# Patient Record
Sex: Male | Born: 1960 | Race: Black or African American | Hispanic: No | Marital: Married | Smoking: Never smoker
Health system: Southern US, Community
[De-identification: ages and names within clinical notes are randomized; demographics above are authoritative.]

## PROBLEM LIST (undated history)

## (undated) DIAGNOSIS — I10 Essential (primary) hypertension: Secondary | ICD-10-CM

## (undated) HISTORY — PX: HEMORRHOID SURGERY: SHX153

## (undated) HISTORY — PX: FOOT SURGERY: SHX648

---

## 2008-12-18 ENCOUNTER — Encounter: Admission: RE | Admit: 2008-12-18 | Discharge: 2008-12-18 | Payer: Self-pay | Admitting: Pulmonary Disease

## 2011-06-01 DIAGNOSIS — K635 Polyp of colon: Secondary | ICD-10-CM | POA: Insufficient documentation

## 2011-06-06 DIAGNOSIS — I10 Essential (primary) hypertension: Secondary | ICD-10-CM | POA: Insufficient documentation

## 2012-10-02 DIAGNOSIS — Z8249 Family history of ischemic heart disease and other diseases of the circulatory system: Secondary | ICD-10-CM | POA: Insufficient documentation

## 2015-05-13 DIAGNOSIS — E785 Hyperlipidemia, unspecified: Secondary | ICD-10-CM | POA: Insufficient documentation

## 2015-05-13 DIAGNOSIS — D72819 Decreased white blood cell count, unspecified: Secondary | ICD-10-CM | POA: Insufficient documentation

## 2015-12-04 ENCOUNTER — Emergency Department (INDEPENDENT_AMBULATORY_CARE_PROVIDER_SITE_OTHER)
Admission: EM | Admit: 2015-12-04 | Discharge: 2015-12-04 | Disposition: A | Discharge status: Home / Self Care | Source: Home / Self Care | Attending: Family Medicine | Admitting: Family Medicine

## 2015-12-04 ENCOUNTER — Emergency Department (INDEPENDENT_AMBULATORY_CARE_PROVIDER_SITE_OTHER)

## 2015-12-04 ENCOUNTER — Encounter: Payer: Self-pay | Admitting: *Deleted

## 2015-12-04 DIAGNOSIS — B9789 Other viral agents as the cause of diseases classified elsewhere: Principal | ICD-10-CM

## 2015-12-04 DIAGNOSIS — J069 Acute upper respiratory infection, unspecified: Secondary | ICD-10-CM

## 2015-12-04 DIAGNOSIS — M940 Chondrocostal junction syndrome [Tietze]: Secondary | ICD-10-CM

## 2015-12-04 DIAGNOSIS — R05 Cough: Secondary | ICD-10-CM | POA: Diagnosis not present

## 2015-12-04 HISTORY — DX: Essential (primary) hypertension: I10

## 2015-12-04 NOTE — ED Provider Notes (Signed)
CSN: [id number]     Arrival date & time 12/04/15  1738 History   First MD Initiated Contact with Patient 12/04/15 1826     Chief Complaint  Patient presents with  . Cough  . Chest Pain      HPI Comments: About 1.5 weeks ago patient developed sinus congestion and sore throat, followed by a productive cough.  During the past 2 to 3 days he has developed pleuritic pain in his right anterior chest.  No fevers, chills, and sweats.  The history is provided by the patient.    Past Medical History  Diagnosis Date  . Hypertension    Past Surgical History  Procedure Laterality Date  . Hemorrhoid surgery    . Foot surgery     Family History  Problem Relation Age of Onset  . Cancer Mother     ovarian  . Diabetes Father   . Stroke Father    Social History  Substance Use Topics  . Smoking status: Never Smoker   . Smokeless tobacco: Never Used  . Alcohol Use: No    Review of Systems + sore throat + cough + sneezing + pleuritic pain + wheezing + nasal congestion + post-nasal drainage No sinus pain/pressure No itchy/red eyes No earache No hemoptysis No SOB No fever/chills + nausea No vomiting No abdominal pain + diarrhea, resolved No urinary symptoms No skin rash + fatigue No myalgias No headache Used OTC meds without relief  Allergies  Review of patient's allergies indicates no known allergies.  Home Medications   Prior to Admission medications   Medication Sig Start Date End Date Taking? Authorizing Provider  amLODipine (NORVASC) 10 MG tablet Take by mouth daily.   Yes Historical Provider, MD   Meds Ordered and Administered this Visit  Medications - No data to display  BP 152/75 mmHg  Pulse 59  Temp(Src) 98.2 F (36.8 C) (Oral)  Resp 16  Ht  (1.702 m)  Wt 178 lb (80.74 kg)  BMI 27.87 kg/m2  SpO2 100% No data found.   Physical Exam  Constitutional: He is oriented to person, place, and time. He appears well-developed and well-nourished. No  distress.  HENT:  Head: Normocephalic.  Right Ear: Tympanic membrane and ear canal normal.  Left Ear: Tympanic membrane and ear canal normal.  Mouth/Throat: Oropharynx is clear and moist.  Eyes: Conjunctivae are normal. Pupils are equal, round, and reactive to light.  Neck: Neck supple.  Cardiovascular: Normal heart sounds.   Pulmonary/Chest: Breath sounds normal. He has no wheezes. He has no rales. He exhibits tenderness.    There is tenderness to palpation over the right anterior chest as noted on diagram.    Abdominal: There is no tenderness.  Musculoskeletal: He exhibits no edema.  Lymphadenopathy:    He has no cervical adenopathy.  Neurological: He is alert and oriented to person, place, and time.  Skin: Skin is warm and dry. No rash noted. He is not diaphoretic.  Nursing note and vitals reviewed.   ED Course  Procedures  None  Imaging Review Dg Chest 2 View  12/04/2015  CLINICAL DATA:  Cough for 1-1/2 weeks EXAM: CHEST  2 VIEW COMPARISON:  12/18/2008 FINDINGS: The heart size and mediastinal contours are within normal limits. Both lungs are clear. The visualized skeletal structures are unremarkable. IMPRESSION: No active cardiopulmonary disease. Electronically Signed   By: Alcide Clever M.D.   On: 12/04/2015 19:20      MDM   1. Viral URI with  cough   2. Costochondritis     Apply ice pack for 20 to 30 minutes, 3 to 4 times daily  Continue until pain decreases.  May take Ibuprofen 200mg , 4 tabs every 8 hours with food for 3 to 5 days until chest pain improves.   May take Delsym Cough Suppressant at bedtime for nighttime cough.  Followup with Family Doctor if not improved in one week.     Lattie HawStephen A Escher Harr, MD 12/08/15 [phone]

## 2015-12-04 NOTE — Discharge Instructions (Signed)
Apply ice pack for 20 to 30 minutes, 3 to 4 times daily  Continue until pain decreases.  May take Ibuprofen 200mg , 4 tabs every 8 hours with food for 3 to 5 days until chest pain improves.   May take Delsym Cough Suppressant at bedtime for nighttime cough.    Costochondritis Costochondritis, sometimes called Tietze syndrome, is a swelling and irritation (inflammation) of the tissue (cartilage) that connects your ribs with your breastbone (sternum). It causes pain in the chest and rib area. Costochondritis usually goes away on its own over time. It can take up to 6 weeks or longer to get better, especially if you are unable to limit your activities. CAUSES  Some cases of costochondritis have no known cause. Possible causes include:  Injury (trauma).  Exercise or activity such as lifting.  Severe coughing. SIGNS AND SYMPTOMS  Pain and tenderness in the chest and rib area.  Pain that gets worse when coughing or taking deep breaths.  Pain that gets worse with specific movements. DIAGNOSIS  Your health care provider will do a physical exam and ask about your symptoms. Chest X-rays or other tests may be done to rule out other problems. TREATMENT  Costochondritis usually goes away on its own over time. Your health care provider may prescribe medicine to help relieve pain. HOME CARE INSTRUCTIONS   Avoid exhausting physical activity. Try not to strain your ribs during normal activity. This would include any activities using chest, abdominal, and side muscles, especially if heavy weights are used.  Apply ice to the affected area for the first 2 days after the pain begins.  Put ice in a plastic bag.  Place a towel between your skin and the bag.  Leave the ice on for 20 minutes, 2-3 times a day.  Only take over-the-counter or prescription medicines as directed by your health care provider. SEEK MEDICAL CARE IF:  You have redness or swelling at the rib joints. These are signs of  infection.  Your pain does not go away despite rest or medicine. SEEK IMMEDIATE MEDICAL CARE IF:   Your pain increases or you are very uncomfortable.  You have shortness of breath or difficulty breathing.  You cough up blood.  You have worse chest pains, sweating, or vomiting.  You have a fever or persistent symptoms for more than 2-3 days.  You have a fever and your symptoms suddenly get worse. MAKE SURE YOU:   Understand these instructions.  Will watch your condition.  Will get help right away if you are not doing well or get worse.   This information is not intended to replace advice given to you by your health care provider. Make sure you discuss any questions you have with your health care provider.   Document Released: 06/15/2005 Document Revised: 06/26/2013 Document Reviewed: 04/09/2013 Elsevier Interactive Patient Education [employer]2016 [employer] Inc[employer]

## 2015-12-04 NOTE — ED Notes (Signed)
Pt c/o productive cough with yellow sputum, sinus drainage and congestion and right sided chest pain that is worse with movement x 1 1/2 weeks. Afebrile.

## 2016-10-10 DIAGNOSIS — Z1159 Encounter for screening for other viral diseases: Secondary | ICD-10-CM | POA: Insufficient documentation

## 2017-05-15 ENCOUNTER — Ambulatory Visit: Admitting: Physical Therapy

## 2017-06-21 ENCOUNTER — Encounter (HOSPITAL_BASED_OUTPATIENT_CLINIC_OR_DEPARTMENT_OTHER): Payer: Self-pay

## 2017-06-21 ENCOUNTER — Emergency Department (HOSPITAL_BASED_OUTPATIENT_CLINIC_OR_DEPARTMENT_OTHER)

## 2017-06-21 ENCOUNTER — Emergency Department (HOSPITAL_BASED_OUTPATIENT_CLINIC_OR_DEPARTMENT_OTHER)
Admission: EM | Admit: 2017-06-21 | Discharge: 2017-06-21 | Disposition: A | Discharge status: Home / Self Care | Attending: Emergency Medicine | Admitting: Emergency Medicine

## 2017-06-21 DIAGNOSIS — I10 Essential (primary) hypertension: Secondary | ICD-10-CM | POA: Diagnosis not present

## 2017-06-21 DIAGNOSIS — Y939 Activity, unspecified: Secondary | ICD-10-CM | POA: Insufficient documentation

## 2017-06-21 DIAGNOSIS — S199XXA Unspecified injury of neck, initial encounter: Secondary | ICD-10-CM | POA: Diagnosis present

## 2017-06-21 DIAGNOSIS — S161XXA Strain of muscle, fascia and tendon at neck level, initial encounter: Secondary | ICD-10-CM | POA: Insufficient documentation

## 2017-06-21 DIAGNOSIS — Y92410 Unspecified street and highway as the place of occurrence of the external cause: Secondary | ICD-10-CM | POA: Diagnosis not present

## 2017-06-21 DIAGNOSIS — Y999 Unspecified external cause status: Secondary | ICD-10-CM | POA: Diagnosis not present

## 2017-06-21 MED ORDER — IBUPROFEN 800 MG PO TABS: 800.0000 mg | ORAL_TABLET | Freq: Three times a day (TID) | ORAL | 0 refills | Status: DC | PRN

## 2017-06-21 MED ORDER — CYCLOBENZAPRINE HCL 10 MG PO TABS: 10.0000 mg | ORAL_TABLET | Freq: Two times a day (BID) | ORAL | 0 refills | Status: DC | PRN
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
## 2017-06-21 NOTE — Discharge Instructions (Signed)
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
## 2017-09-22 ENCOUNTER — Emergency Department (INDEPENDENT_AMBULATORY_CARE_PROVIDER_SITE_OTHER)
Admission: EM | Admit: 2017-09-22 | Discharge: 2017-09-22 | Disposition: A | Discharge status: Home / Self Care | Source: Home / Self Care | Attending: Family Medicine | Admitting: Family Medicine

## 2017-09-22 ENCOUNTER — Other Ambulatory Visit: Payer: Self-pay

## 2017-09-22 ENCOUNTER — Encounter: Payer: Self-pay | Admitting: Emergency Medicine

## 2017-09-22 ENCOUNTER — Emergency Department (INDEPENDENT_AMBULATORY_CARE_PROVIDER_SITE_OTHER)

## 2017-09-22 DIAGNOSIS — R05 Cough: Secondary | ICD-10-CM

## 2017-09-22 DIAGNOSIS — J40 Bronchitis, not specified as acute or chronic: Secondary | ICD-10-CM

## 2017-09-22 DIAGNOSIS — R0789 Other chest pain: Secondary | ICD-10-CM

## 2017-09-22 MED ORDER — AZITHROMYCIN 250 MG PO TABS: ORAL_TABLET | ORAL | 0 refills | Status: DC

## 2017-09-22 MED ORDER — PREDNISONE 20 MG PO TABS: ORAL_TABLET | ORAL | 0 refills | Status: DC

## 2017-09-22 NOTE — ED Triage Notes (Signed)
Reports he has had a cough and sore throat intermittently for one month. No OTCs today.

## 2017-09-22 NOTE — Discharge Instructions (Signed)
Take plain guaifenesin (1200mg extended release tabs such as Mucinex) twice daily, with plenty of water, for cough and congestion.  Get adequate rest.   °May take Delsym Cough Suppressant at bedtime for nighttime cough.  °Stop all antihistamines for now, and other non-prescription cough/cold preparations. °  °  °

## 2017-09-22 NOTE — ED Provider Notes (Signed)
[facility]    CSN: [id number] Arrival date & time: 09/22/17  1710     History   Chief Complaint Chief Complaint  Patient presents with  . Cough  . Sore Throat    HPI Mathew Carter is a 57 y.o. male.   Patient reports that he developed URI symptoms about a month ago with initial sore throat, fever, sinus congestion, and cough.  His cough has persisted and become worse during the past 3 to 4 days.  He has developed right chest "tightness" when coughing during the past 3 days.   The history is provided by the patient.    Past Medical History:  Diagnosis Date  . Hypertension     There are no active problems to display for this patient.   Past Surgical History:  Procedure Laterality Date  . FOOT SURGERY    . HEMORRHOID SURGERY         Home Medications    Prior to Admission medications   Medication Sig Start Date End Date Taking? Authorizing Provider  amLODipine (NORVASC) 10 MG tablet Take by mouth daily.    [provider]  azithromycin (ZITHROMAX Z-PAK) 250 MG tablet Take 2 tabs today; then begin one tab once daily for 4 more days. 09/22/17   Lattie Haw, MD  cyclobenzaprine (FLEXERIL) 10 MG tablet Take 1 tablet (10 mg total) by mouth 2 (two) times daily as needed for muscle spasms. 06/21/17   Long, Arlyss Repress, MD  ibuprofen (ADVIL,MOTRIN) 800 MG tablet Take 1 tablet (800 mg total) by mouth every 8 (eight) hours as needed. 06/21/17   Long, Arlyss Repress, MD  predniSONE (DELTASONE) 20 MG tablet Take one tab by mouth twice daily for 4 days, then one daily. Take with food. 09/22/17   Lattie Haw, MD    Family History Family History  Problem Relation Age of Onset  . Cancer Mother        ovarian  . Diabetes Father   . Stroke Father     Social History Social History   Tobacco Use  . Smoking status: Never Smoker  . Smokeless tobacco: Never Used  Substance Use Topics  . Alcohol use: Yes    Comment: occ  . Drug use: No     Allergies    Patient has no known allergies.   Review of Systems Review of Systems + sore throat + cough + pleuritic pain No wheezing + nasal congestion + post-nasal drainage No sinus pain/pressure No itchy/red eyes No earache No hemoptysis No SOB + fever/chills No nausea No vomiting No abdominal pain No diarrhea No urinary symptoms No skin rash + fatigue No myalgias No headache Used OTC meds without relief   Physical Exam Triage Vital Signs ED Triage Vitals  Enc Vitals Group     BP --      Pulse Rate 09/22/17 1754 (!) 55     Resp --      Temp 09/22/17 1754 98.6 F (37 C)     Temp Source 09/22/17 1754 Oral     SpO2 09/22/17 1754 100 %     Weight 09/22/17 1755 178 lb (80.7 kg)     Height 09/22/17 1755 5\' 7"  (1.702 m)     Head Circumference --      Peak Flow --      Pain Score --      Pain Loc --      Pain Edu? --      Excl. in  GC? --    No data found.  Updated Vital Signs Pulse (!) 55   Temp 98.6 F (37 C) (Oral)   Ht 5\' 7"  (1.702 m)   Wt 178 lb (80.7 kg)   SpO2 100%   BMI 27.88 kg/m   Visual Acuity Right Eye Distance:   Left Eye Distance:   Bilateral Distance:    Right Eye Near:   Left Eye Near:    Bilateral Near:     Physical Exam Nursing notes and Vital Signs reviewed. Appearance:  Patient appears stated age, and in no acute distress Eyes:  Pupils are equal, round, and reactive to light and accomodation.  Extraocular movement is intact.  Conjunctivae are not inflamed  Ears:  Canals normal.  Tympanic membranes normal.  Nose:  Mildly congested turbinates.  No sinus tenderness.  Pharynx:  Normal Neck:  Supple.  Enlarged posterior/lateral nodes are palpated bilaterally, tender to palpation on the left.   Lungs:  Clear to auscultation.  Breath sounds are equal.  Moving air well. Heart:  Regular rate and rhythm without murmurs, rubs, or gallops.  Abdomen:  Nontender without masses or hepatosplenomegaly.  Bowel sounds are present.  No CVA or flank  tenderness.  Extremities:  No edema.  Skin:  No rash present.    UC Treatments / Results  Labs (all labs ordered are listed, but only abnormal results are displayed) Labs Reviewed - No data to display  EKG  EKG Interpretation None       Radiology Dg Chest 2 View  Result Date: 09/22/2017 CLINICAL DATA:  Cough for 1 month. Right-sided chest pain for 4 days. History of hypertension. EXAM: CHEST  2 VIEW COMPARISON:  12/04/2015 FINDINGS: Mild hyperinflation. The heart size and mediastinal contours are within normal limits. Both lungs are clear. The visualized skeletal structures are unremarkable. IMPRESSION: No active cardiopulmonary disease. Electronically Signed   By: Burman NievesWilliam  Stevens M.D.   On: 09/22/2017 19:05    Procedures Procedures (including critical care time)  Medications Ordered in UC Medications - No data to display   Initial Impression / Assessment and Plan / UC Course  I have reviewed the triage vital signs and the nursing notes.  Pertinent labs & imaging results that were available during my care of the patient were reviewed by me and considered in my medical decision making (see chart for details).    Begin Z-pak for atypical coverage. Begin prednisone burst/taper. Take plain guaifenesin (1200mg  extended release tabs such as Mucinex) twice daily, with plenty of water, for cough and congestion.  Get adequate rest.   May take Delsym Cough Suppressant at bedtime for nighttime cough.  Stop all antihistamines for now, and other non-prescription cough/cold preparations. Followup with Family Doctor if not improved in one week.     Final Clinical Impressions(s) / UC Diagnoses   Final diagnoses:  Bronchitis    ED Discharge Orders        Ordered    azithromycin (ZITHROMAX Z-PAK) 250 MG tablet     09/22/17 1924    predniSONE (DELTASONE) 20 MG tablet     09/22/17 1924          Lattie HawBeese, Stephen A, MD 09/25/17 2037

## 2017-09-24 ENCOUNTER — Telehealth: Payer: Self-pay | Admitting: Emergency Medicine

## 2017-09-24 NOTE — Telephone Encounter (Signed)
Patient states he is feeling better. Advised to CB with concerns. AP, CMA

## 2017-10-11 DIAGNOSIS — R7309 Other abnormal glucose: Secondary | ICD-10-CM | POA: Insufficient documentation

## 2017-11-06 ENCOUNTER — Other Ambulatory Visit: Payer: Self-pay

## 2017-11-06 ENCOUNTER — Emergency Department (INDEPENDENT_AMBULATORY_CARE_PROVIDER_SITE_OTHER)
Admission: EM | Admit: 2017-11-06 | Discharge: 2017-11-06 | Disposition: A | Discharge status: Home / Self Care | Source: Home / Self Care | Attending: Family Medicine | Admitting: Family Medicine

## 2017-11-06 DIAGNOSIS — R6889 Other general symptoms and signs: Secondary | ICD-10-CM | POA: Diagnosis not present

## 2017-11-06 MED ORDER — BENZONATATE 100 MG PO CAPS: 100.0000 mg | ORAL_CAPSULE | Freq: Three times a day (TID) | ORAL | 0 refills | Status: DC

## 2017-11-06 MED ORDER — OSELTAMIVIR PHOSPHATE 75 MG PO CAPS: 75.0000 mg | ORAL_CAPSULE | Freq: Two times a day (BID) | ORAL | 0 refills | Status: DC

## 2017-11-06 NOTE — Discharge Instructions (Signed)
°  You may take 500mg acetaminophen every 4-6 hours or in combination with ibuprofen 400-600mg every 6-8 hours as needed for pain, inflammation, and fever. ° °Be sure to drink at least eight 8oz glasses of water to stay well hydrated and get at least 8 hours of sleep at night, preferably more while sick.  ° °Oseltamivir (Tamiflu) may cause stomach upset including nausea, vomiting and diarrhea.  It may also cause dizziness or hallucinations in children.  To help prevent stomach upset, you may take this medication with food.  If you are still having unwanted symptoms, you may stop taking this medication as it is not as important to finish the entire course like antibiotics.  If you have questions/concerns please call our office or follow up with your primary care provider.   ° °

## 2017-11-06 NOTE — ED Provider Notes (Signed)
[facility]    CSN: [id number] Arrival date & time: 11/06/17  4098     History   Chief Complaint Chief Complaint  Patient presents with  . Generalized Body Aches    HPI Mathew Carter is a 57 y.o. male.   HPI  Mathew Carter is a 57 y.o. male presenting to UC with c/o "not feeling good" 2 days ago, felt much worse yesterday.  He is c/o generalized body aches and nasal congestion, subjective fever and fatigue.  Minimal cough. Denies n/v/d. No known sick contacts. He did not get the flu vaccine.    Past Medical History:  Diagnosis Date  . Hypertension     There are no active problems to display for this patient.   Past Surgical History:  Procedure Laterality Date  . FOOT SURGERY    . HEMORRHOID SURGERY         Home Medications    Prior to Admission medications   Medication Sig Start Date End Date Taking? Authorizing Provider  amLODipine (NORVASC) 10 MG tablet Take by mouth daily.    [provider]  azithromycin (ZITHROMAX Z-PAK) 250 MG tablet Take 2 tabs today; then begin one tab once daily for 4 more days. 09/22/17   Lattie Haw, MD  benzonatate (TESSALON) 100 MG capsule Take 1-2 capsules (100-200 mg total) by mouth every 8 (eight) hours. 11/06/17   Lurene Shadow, PA-C  cyclobenzaprine (FLEXERIL) 10 MG tablet Take 1 tablet (10 mg total) by mouth 2 (two) times daily as needed for muscle spasms. 06/21/17   Long, Arlyss Repress, MD  ibuprofen (ADVIL,MOTRIN) 800 MG tablet Take 1 tablet (800 mg total) by mouth every 8 (eight) hours as needed. 06/21/17   Long, Arlyss Repress, MD  oseltamivir (TAMIFLU) 75 MG capsule Take 1 capsule (75 mg total) by mouth every 12 (twelve) hours. 11/06/17   Lurene Shadow, PA-C  predniSONE (DELTASONE) 20 MG tablet Take one tab by mouth twice daily for 4 days, then one daily. Take with food. 09/22/17   Lattie Haw, MD    Family History Family History  Problem Relation Age of Onset  . Cancer Mother        ovarian  .  Diabetes Father   . Stroke Father     Social History Social History   Tobacco Use  . Smoking status: Never Smoker  . Smokeless tobacco: Never Used  Substance Use Topics  . Alcohol use: No    Frequency: Never    Comment: occ  . Drug use: No     Allergies   Patient has no known allergies.   Review of Systems Review of Systems  Constitutional: Positive for chills and fever.  HENT: Positive for congestion and rhinorrhea. Negative for ear pain, sore throat, trouble swallowing and voice change.   Respiratory: Positive for cough. Negative for shortness of breath.   Cardiovascular: Negative for chest pain and palpitations.  Gastrointestinal: Negative for abdominal pain, diarrhea, nausea and vomiting.  Musculoskeletal: Positive for arthralgias, back pain and myalgias.  Skin: Negative for rash.  Neurological: Positive for weakness and headaches. Negative for dizziness and light-headedness.     Physical Exam Triage Vital Signs ED Triage Vitals  Enc Vitals Group     BP 11/06/17 1016 (!) 151/84     Pulse Rate 11/06/17 1016 66     Resp --      Temp 11/06/17 1016 98 F (36.7 C)     Temp Source 11/06/17 1016 Oral  SpO2 11/06/17 1016 97 %     Weight 11/06/17 1017 189 lb (85.7 kg)     Height 11/06/17 1017 5\' 7"  (1.702 m)     Head Circumference --      Peak Flow --      Pain Score 11/06/17 1017 0     Pain Loc --      Pain Edu? --      Excl. in GC? --    No data found.  Updated Vital Signs BP (!) 151/84 (BP Location: Right Arm)   Pulse 66   Temp 98 F (36.7 C) (Oral)   Ht 5\' 7"  (1.702 m)   Wt 189 lb (85.7 kg)   SpO2 97%   BMI 29.60 kg/m   Visual Acuity Right Eye Distance:   Left Eye Distance:   Bilateral Distance:    Right Eye Near:   Left Eye Near:    Bilateral Near:     Physical Exam  Constitutional: He is oriented to person, place, and time. He appears well-developed and well-nourished. No distress.  HENT:  Head: Normocephalic and atraumatic.  Right  Ear: Tympanic membrane normal.  Left Ear: Tympanic membrane normal.  Nose: Nose normal. Right sinus exhibits no maxillary sinus tenderness and no frontal sinus tenderness. Left sinus exhibits no maxillary sinus tenderness and no frontal sinus tenderness.  Mouth/Throat: Uvula is midline, oropharynx is clear and moist and mucous membranes are normal.  Eyes: EOM are normal.  Neck: Normal range of motion. Neck supple.  Cardiovascular: Normal rate and regular rhythm.  Pulmonary/Chest: Effort normal and breath sounds normal. No stridor. No respiratory distress. He has no wheezes. He has no rales.  Musculoskeletal: Normal range of motion.  Neurological: He is alert and oriented to person, place, and time.  Skin: Skin is warm and dry. He is not diaphoretic.  Psychiatric: He has a normal mood and affect. His behavior is normal.  Nursing note and vitals reviewed.    UC Treatments / Results  Labs (all labs ordered are listed, but only abnormal results are displayed) Labs Reviewed - No data to display  EKG  EKG Interpretation None       Radiology No results found.  Procedures Procedures (including critical care time)  Medications Ordered in UC Medications - No data to display   Initial Impression / Assessment and Plan / UC Course  I have reviewed the triage vital signs and the nursing notes.  Pertinent labs & imaging results that were available during my care of the patient were reviewed by me and considered in my medical decision making (see chart for details).     Hx and exam c/w flu-like symptoms w/o evidence of underlying bacterial infection.  Will tx symptomatically  F/u with PCP in 1 week if needed.   Final Clinical Impressions(s) / UC Diagnoses   Final diagnoses:  Flu-like symptoms    ED Discharge Orders        Ordered    oseltamivir (TAMIFLU) 75 MG capsule  Every 12 hours     11/06/17 1028    benzonatate (TESSALON) 100 MG capsule  Every 8 hours     11/06/17  1028       Controlled Substance Prescriptions [place] Controlled Substance Registry consulted? Not Applicable   Rolla Platehelps, Reagan Klemz O, PA-C 11/06/17 1241

## 2017-11-06 NOTE — ED Triage Notes (Signed)
Pt stated that he was not feeling good Saturday evening, then felt much worse after church on Sunday.  Generalized aching all over, and nasal congestion,.

## 2018-02-21 ENCOUNTER — Encounter: Payer: Self-pay | Admitting: Family Medicine

## 2018-02-21 ENCOUNTER — Ambulatory Visit (INDEPENDENT_AMBULATORY_CARE_PROVIDER_SITE_OTHER): Admitting: Family Medicine

## 2018-02-21 DIAGNOSIS — N529 Male erectile dysfunction, unspecified: Secondary | ICD-10-CM | POA: Diagnosis not present

## 2018-02-21 DIAGNOSIS — G8929 Other chronic pain: Secondary | ICD-10-CM

## 2018-02-21 DIAGNOSIS — M545 Low back pain, unspecified: Secondary | ICD-10-CM

## 2018-02-21 DIAGNOSIS — M549 Dorsalgia, unspecified: Secondary | ICD-10-CM | POA: Insufficient documentation

## 2018-02-21 MED ORDER — SILDENAFIL CITRATE 100 MG PO TABS: 50.0000 mg | ORAL_TABLET | Freq: Every day | ORAL | 0 refills | Status: DC | PRN

## 2018-02-21 NOTE — Progress Notes (Signed)
Mathew Carter is a 57 y.o. male who [facility] today for back pain.   Mathew Brownsnthony has a 1.5 year history of back pain. He notes pain in the low back. He denies any radiating pain weakness or numbness. He denies any bowel or bladder dysfunction. He does note some erection dysfunction however.  He has tried Flexeril and ibuprofen which has helped a bit.  He notes his symptoms are worse with prolonged sitting.  He notes his symptoms improved with some home exercises.  He has been evaluated by neurosurgery  (Dr Eleanora Neighboravid S. Jones)and had reportedly had an MRI but is unaware of results.  This was in 2018.   ROS as above: No headache, visual changes, nausea, vomiting, diarrhea, constipation, dizziness, abdominal pain, skin rash, fevers, chills, night sweats, weight loss, swollen lymph nodes, body aches, joint swelling, muscle aches, chest pain, shortness of breath, mood changes, visual or auditory hallucinations.    Exam:  BP 137/81   Pulse 60   Ht 5\' 7"  (1.702 m)   Wt 190 lb (86.2 kg)   BMI 29.76 kg/m  Gen: Well NAD HEENT: EOMI,  MMM Lungs: Normal work of breathing. CTABL Heart: RRR no MRG Abd: NABS, Soft. Nondistended, Nontender Exts: Brisk capillary refill, warm and well perfused.  L-spine: Nontender to spinal midline.  Mildly tender to palpation bilateral lumbar paraspinal muscle group. Lumbar motion is normal. Lower extremity strength reflexes sensation are intact. Normal gait.   Assessment and Plan: 57 y.o. male with  Low back pain present for 1.5 years without injury.  Patient has reportedly already had what sounds like a reasonable evaluation for this with an MRI.  I will request medical records to obtain office notes and MRI report.  However he has not had treatment with physical therapy at this point.  As he improves with his home exercise program  I think it is reasonable to proceed with formal physical therapy.  Check back in about 6 weeks.  Additionally patient has erectile dysfunction.  I do not believe this is related to his back pain however MRI report will be helpful for this.  I am not his primary care provider however I think it is reasonable to treat his ED with viagra trial. Follow up with PCP for further Rx and treatment.   HTN: Follow up with PCP in the near future.    Orders Placed This Encounter  Procedures  . Ambulatory referral to Physical Therapy    Referral Priority:   Routine    Referral Type:   Physical Medicine    Referral Reason:   Specialty Services Required    Requested Specialty:   Physical Therapy   Meds ordered this encounter  Medications  . DISCONTD: sildenafil (VIAGRA) 100 MG tablet    Sig: Take 0.5-1 tablets (50-100 mg total) by mouth daily as needed for erectile dysfunction.    Dispense:  10 tablet    Refill:  0  . sildenafil (VIAGRA) 100 MG tablet    Sig: Take 0.5-1 tablets (50-100 mg total) by mouth daily  as needed for erectile dysfunction.    Dispense:  10 tablet    Refill:  0     Historical information moved to improve visibility of documentation.  Past Medical History:  Diagnosis Date  . Hypertension    Past Surgical History:  Procedure Laterality Date  . FOOT SURGERY    . HEMORRHOID SURGERY     Social History   Tobacco Use  . Smoking status: Never Smoker  . Smokeless tobacco: Never Used  Substance Use Topics  . Alcohol use: No    Frequency: Never    Comment: occ   family history includes Cancer in his mother; Diabetes in his father; Stroke in his father.  Medications: Current Outpatient Medications  Medication Sig Dispense Refill  . amLODipine (NORVASC) 10 MG tablet Take by mouth daily.    . valsartan-hydrochlorothiazide (DIOVAN-HCT) 160-12.5 MG tablet TAKE 1 TABLET DAILY    . aspirin 81 MG chewable tablet Chew by mouth.    . sildenafil (VIAGRA) 100 MG tablet Take  0.5-1 tablets (50-100 mg total) by mouth daily as needed for erectile dysfunction. 10 tablet 0   No current facility-administered medications for this visit.    No Known Allergies   Discussed warning signs or symptoms. Please see discharge instructions. Patient expresses understanding.  CC: Janece Canterbury, MD

## 2018-02-21 NOTE — Patient Instructions (Signed)
Thank you for coming in today. We will get records form Neurosurgery.  Attend PT Recheck with me in 6 weeks.  Come back or go to the emergency room if you notice new weakness new numbness problems walking or bowel or bladder problems.  For ED Try viagra.  Follow up with your doctor.   Sildenafil tablets (Viagra) What is this medicine? SILDENAFIL (sil DEN a fil) is used to treat erection problems in men. This medicine may be used for other purposes; ask your health care provider or pharmacist if you have questions. COMMON BRAND NAME(S): Viagra What should I tell my health care provider before I take this medicine? They need to know if you have any of these conditions: -bleeding disorders -eye or vision problems, including a rare inherited eye disease called retinitis pigmentosa -anatomical deformation of the penis, Peyronie's disease, or history of priapism (painful and prolonged erection) -heart disease, angina, a history of heart attack, irregular heart beats, or other heart problems -high or low blood pressure -history of blood diseases, like sickle cell anemia or leukemia -history of stomach bleeding -kidney disease -liver disease -stroke -an unusual or allergic reaction to sildenafil, other medicines, foods, dyes, or preservatives -pregnant or trying to get pregnant -breast-feeding How should I use this medicine? Take this medicine by mouth with a glass of water. Follow the directions on the prescription label. The dose is usually taken 1 hour before sexual activity. You should not take the dose more than once per day. Do not take your medicine more often than directed. Talk to your pediatrician regarding the use of this medicine in children. This medicine is not used in children for this condition. Overdosage: If you think you have taken too much of this medicine contact a poison control center or emergency room at once. NOTE: This medicine is only for you. Do not share this  medicine with others. What if I miss a dose? This does not apply. Do not take double or extra doses. What may interact with this medicine? Do not take this medicine with any of the following medications: -cisapride -nitrates like amyl nitrite, isosorbide dinitrate, isosorbide mononitrate, nitroglycerin -riociguat This medicine may also interact with the following medications: -antiviral medicines for HIV or AIDS -bosentan -certain medicines for benign prostatic hyperplasia (BPH) -certain medicines for blood pressure -certain medicines for fungal infections like ketoconazole and itraconazole -cimetidine -erythromycin -rifampin This list may not describe all possible interactions. Give your health care provider a list of all the medicines, herbs, non-prescription drugs, or dietary supplements you use. Also tell them if you smoke, drink alcohol, or use illegal drugs. Some items may interact with your medicine. What should I watch for while using this medicine? If you notice any changes in your vision while taking this drug, call your doctor or health care professional as soon as possible. Stop using this medicine and call your health care provider right away if you have a loss of sight in one or both eyes. Contact your doctor or health care professional right away if you have an erection that lasts longer than 4 hours or if it becomes painful. This may be a sign of a serious problem and must be treated right away to prevent permanent damage. If you experience symptoms of nausea, dizziness, chest pain or arm pain upon initiation of sexual activity after taking this medicine, you should refrain from further activity and call your doctor or health care professional as soon as possible. Do not drink alcohol to  excess (examples, 5 glasses of wine or 5 shots of whiskey) when taking this medicine. When taken in excess, alcohol can increase your chances of getting a headache or getting dizzy, increasing  your heart rate or lowering your blood pressure. Using this medicine does not protect you or your partner against HIV infection (the virus that causes AIDS) or other sexually transmitted diseases. What side effects may I notice from receiving this medicine? Side effects that you should report to your doctor or health care professional as soon as possible: -allergic reactions like skin rash, itching or hives, swelling of the face, lips, or tongue -breathing problems -changes in hearing -changes in vision -chest pain -fast, irregular heartbeat -prolonged or painful erection -seizures Side effects that usually do not require medical attention (report to your doctor or health care professional if they continue or are bothersome): -back pain -dizziness -flushing -headache -indigestion -muscle aches -nausea -stuffy or runny nose This list may not describe all possible side effects. Call your doctor for medical advice about side effects. You may report side effects to FDA at 1-800-FDA-1088. Where should I keep my medicine? Keep out of reach of children. Store at room temperature between 15 and 30 degrees C (59 and 86 degrees F). Throw away any unused medicine after the expiration date. NOTE: This sheet is a summary. It may not cover all possible information. If you have questions about this medicine, talk to your doctor, pharmacist, or health care provider.  2018 Elsevier/Gold Standard (2015-08-19 12:00:25)

## 2018-02-28 ENCOUNTER — Encounter: Payer: Self-pay | Admitting: Family Medicine

## 2018-02-28 NOTE — Progress Notes (Unsigned)
Records received from [facility] Patient is listed incorrectly in their medical records as last name Sharol HarnessSimmons (should be Kumpf)  Records will be scanned however MRI report from July 2018 shows (images available in PACS system) Impression: 1.  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  Mild right foraminal narrowing at L1-2 3. Mild left subarticular and foraminal narrowing at L4-5 4.  He is the most prominent disease is at L5-S1 with mild right subarticular moderate right foraminal stenosis. 5.  Multilevel facet hypertrophy and short pedicles as described above.  Per neurosurgery notes plan after MRI follow-up was for physical therapy referral.     Notes will be scanned

## 2018-03-01 ENCOUNTER — Encounter: Payer: Self-pay | Admitting: Physical Therapy

## 2018-03-01 ENCOUNTER — Ambulatory Visit (INDEPENDENT_AMBULATORY_CARE_PROVIDER_SITE_OTHER): Admitting: Physical Therapy

## 2018-03-01 DIAGNOSIS — M545 Low back pain, unspecified: Secondary | ICD-10-CM

## 2018-03-01 DIAGNOSIS — G8929 Other chronic pain: Secondary | ICD-10-CM | POA: Diagnosis not present

## 2018-03-01 DIAGNOSIS — M6281 Muscle weakness (generalized): Secondary | ICD-10-CM

## 2018-03-01 DIAGNOSIS — M62830 Muscle spasm of back: Secondary | ICD-10-CM | POA: Diagnosis not present

## 2018-03-01 NOTE — Patient Instructions (Addendum)
For the gym: Add in leg press Lat pull down Seated row.  Pelvic Tilt    Lie on back, legs bent. Exhale, tilting top of pelvis back, pubic bone up, to flatten lower back. Inhale, rolling pelvis opposite way, top forward, pubic bone down, arch in back. Repeat _10-20___ times. Do __1__ sessions per day.  Hip External Rotation With Pillow: Transverse Plane Stability - make sure you breathe.    KEEP BOTH KNEES BENT.  Slowly roll bent knee out. Be sure pelvis does not rotate. Do _10-20__ times. Restabilize pelvis. Repeat with other leg. Do _1-2__ sets, _1__ times per day.  Knee-to-Chest Stretch: Bilateral    With hands behind knees, pull both knees in to chest until a comfortable stretch is felt in lower back and buttocks. Keep back relaxed. Hold __30__ seconds. Repeat ____ times per set. Do ___1_ sets per session. Do ___1_ sessions per day.1   Quads / HF, Prone    Lie face down, knees together. Grasp one ankle with same-side hand. Use towel if needed to reach. Gently pull foot toward buttock. Hold _30-45__ seconds. Repeat _1__ times per session. Do _1__ sessions per day.  Cat / Cow Flow    Inhale, press spine toward ceiling like a Halloween cat. Keeping strength in arms and abdominals, exhale to soften spine through neutral and into cow pose. Open chest and arch back. Initiate movement between cat and cow at tailbone, one vertebrae at a time. Repeat ___10_ times. Once a day.    Transverse abdominal muscle not working correctly, - need to strengthen that so it can help stabilize back Quads are tight and pulling on the pelvis - holding it forward, which places pressure on the back/spine The lower 3 vertebra of you back are tight and not moving the way they need to   Trigger Point Dry Needling - a treatment option for the tight muscles in the right low back.   . What is Trigger Point Dry Needling (DN)? o DN is a physical therapy technique used to treat muscle pain and  dysfunction. Specifically, DN helps deactivate muscle trigger points (muscle knots).  o A thin filiform needle is used to penetrate the skin and stimulate the underlying trigger point. The goal is for a local twitch response (LTR) to occur and for the trigger point to relax. No medication of any kind is injected during the procedure.   . What Does Trigger Point Dry Needling Feel Like?  o The procedure feels different for each individual patient. Some patients report that they do not actually feel the needle enter the skin and overall the process is not painful. Very mild bleeding may occur. However, many patients feel a deep cramping in the muscle in which the needle was inserted. This is the local twitch response.   Marland Kitchen How Will I feel after the treatment? o Soreness is normal, and the onset of soreness may not occur for a few hours. Typically this soreness does not last longer than two days.  o Bruising is uncommon, however; ice can be used to decrease any possible bruising.  o In rare cases feeling tired or nauseous after the treatment is normal. In addition, your symptoms may get worse before they get better, this period will typically not last longer than 24 hours.   . What Can I do After My Treatment? o Increase your hydration by drinking more water for the next 24 hours. o You may place ice or heat on the areas treated that  have become sore, however, do not use heat on inflamed or bruised areas. Heat often brings more relief post needling. o You can continue your regular activities, but vigorous activity is not recommended initially after the treatment for 24 hours. o DN is best combined with other physical therapy such as strengthening, stretching, and other therapies.    Housework - Vacuuming    Hold the vacuum with arm held at side. Step back and forth to move it, keeping head up. Avoid twisting.   Copyright  VHI. All rights reserved.

## 2018-03-01 NOTE — Therapy (Signed)
[facility] [place] [address] [place], [place], [address] Phone: [phone]   Fax:  [phone]  Physical Therapy Evaluation  Patient Details  Name: Mathew Carter MRN: [id number] Date of Birth: August 03, 1961 Referring Provider:  Dr Clementeen Graham   Encounter Date: 03/01/2018  PT End of Session - 03/01/18 1107    Visit Number  1    Number of Visits  10    Date for PT Re-Evaluation  04/05/18    PT Start Time  1108    PT Stop Time  1206    PT Time Calculation (min)  58 min       Past Medical History:  Diagnosis Date  . Hypertension     Past Surgical History:  Procedure Laterality Date  . FOOT SURGERY    . HEMORRHOID SURGERY      There were no vitals filed for this visit.   Subjective Assessment - 03/01/18 1110    Subjective  Pt reports he developed LBP about a year ago.  He owns a cleaning service and he thinks that maybe he over did it with that and hurt the back.  He has tried to change the way he works and do exercises however still has issues.  Currently doing the ellicpical, resisted core work and stretches.     How long can you sit comfortably?  none - however feels his back    Patient Stated Goals  concerned about having intercourse and being able to work.     Currently in Pain?  Yes    Pain Score  8     Pain Location  Back    Pain Orientation  Right;Lower    Pain Descriptors / Indicators  Aching    Pain Type  Chronic pain    Pain Onset  More than a month ago    Pain Frequency  Constant    Aggravating Factors   driving, bending forward, first thing in morning has tightness    Pain Relieving Factors  uses muscle rub cream,          [facility] PT Assessment - 03/01/18 0001      Assessment   Medical Diagnosis  chronic LBP    Referring Provider   Dr Clementeen Graham    Onset Date/Surgical Date  03/01/17    Hand Dominance  Right    Next MD Visit  5 weeks    Prior Therapy  none      Precautions   Precautions  None       Balance Screen   Has the patient fallen in the past 6 months  No      Prior Function   Level of Independence  Independent    Vocation  Full time employment    Vocation Requirements  owns a cleaning service    Leisure  would like to play soccer again, currently sedentary      Observation/Other Assessments   Focus on Therapeutic Outcomes (FOTO)   39% limited      Functional Tests   Functional tests  Squat;Single leg stance      Squat   Comments  WNL      Single Leg Stance   Comments  WNL      Posture/Postural Control   Posture/Postural Control  Postural limitations    Postural Limitations  Increased lumbar lordosis      ROM / Strength   AROM / PROM / Strength  AROM;Strength      AROM  AROM Assessment Site  Lumbar;Hip    Lumbar Flexion  to floor with pull in low back across the bottom, pulling with return to stand    Lumbar Extension  WNL, stiffness low back    Lumbar - Right Side Bend  75% present with back pain    Lumbar - Left Side Bend  WNL    Lumbar - Right Rotation  WNL with stiffness in Rt low back    Lumbar - Left Rotation  WNL      Strength   Strength Assessment Site  Hip;Knee;Ankle;Lumbar    Right/Left Hip  -- WNL    Right/Left Knee  -- WNL    Right/Left Ankle  -- WNL    Lumbar Flexion  -- TA fair    Lumbar Extension  -- multifidi WNL       Flexibility   Soft Tissue Assessment /Muscle Length  yes    Hamstrings  supine SLR Rt 75, Lt 75    Quadriceps  prone knee flex Rt 114, Lt 130      Palpation   Spinal mobility  hypomobile L5-3 with tenderness    Palpation comment  tight and tender in Rt lumbar paraspinals a band of tightness.       Special Tests    Special Tests  -- (-) lumbar special tests                Objective measurements completed on examination: See above findings.      [facility] Adult PT Treatment/Exercise - 03/01/18 0001      Self-Care   Self-Care  Other Self-Care Comments    Other Self-Care Comments   discussed spinal  motion, how tightness and weakness effect the back, instructed in safe way to vacuum as this activity causes him a lot of pain      Exercises   Exercises  Lumbar      Lumbar Exercises: Stretches   Double Knee to Chest Stretch  1 rep;30 seconds    Quadruped Mid Back Stretch  -- 10 reps cat/cow with lots of VC for form    Quad Stretch  Left;Right;30 seconds prone with strap      Lumbar Exercises: Supine   Pelvic Tilt  10 reps required physical and verbal cues    Clam  10 reps single leg, each side Vc for form TA engagement             PT Education - 03/01/18 1207    Education Details  HEP and back mechanics    Person(s) Educated  Patient    Methods  Explanation;Demonstration;Handout    Comprehension  Verbal cues required;Returned demonstration;Verbalized understanding          PT Long Term Goals - 03/01/18 1219      PT LONG TERM GOAL #1   Title  I with HEP ( 04/05/18)     Time  5    Period  Weeks    Status  New      PT LONG TERM GOAL #2   Title  improve FOTO =/< 31% limited (04/05/18)      Time  5    Period  Weeks    Status  New      PT LONG TERM GOAL #3   Title  report =/> 75% reduction in pain with working  ( 04/05/18)     Time  5    Period  Weeks    Status  New      PT  LONG TERM GOAL #4   Title  demo good control of pelvis with abdominal strengthening (04/05/18)     Time  5    Period  Weeks    Status  New      PT LONG TERM GOAL #5   Title  report improvement with intercourse /being able to maintain a strong core contracation per his goals/concerns ( 04/05/18)     Time  5    Period  Weeks    Status  New             Plan - 03/01/18 1207    Clinical Impression Statement  57 yo male with long h/o low back pain that has gotten progressively worse.  He has hypomobility in the lower lumbar vertebrae, deep core weakness, some tightness in his quads and Rt lumbar paraspinals and difficulty with some of his leisure activities and work. Mathew Carter had a lot of  difficulty performing deep core ex and getting segmental motion in the lower lumbar spine.     Clinical Presentation  Stable    Clinical Decision Making  Low    Rehab Potential  Good    PT Frequency  2x / week    PT Duration  -- 5 weeks    PT Treatment/Interventions  Dry needling;Manual techniques;Moist Heat;Ultrasound;Cryotherapy;Electrical Stimulation;Therapeutic exercise;Therapeutic activities;Patient/family education    PT Next Visit Plan  progress HEP - deep core muscles, improve segmental motion in lumbar spine through mobs and exercise, possible DN     Consulted and Agree with Plan of Care  Patient       Patient will benefit from skilled therapeutic intervention in order to improve the following deficits and impairments:  Pain, Improper body mechanics, Increased muscle spasms, Hypomobility, Decreased strength  Visit Diagnosis: Chronic bilateral low back pain without sciatica - Plan: PT plan of care cert/re-cert  Muscle weakness (generalized) - Plan: PT plan of care cert/re-cert  Muscle spasm of back - Plan: PT plan of care cert/re-cert     Problem List Patient Active Problem List   Diagnosis Date Noted  . Back pain 02/21/2018  . ED (erectile dysfunction) 02/21/2018  . Abnormal glucose 10/11/2017  . Need for hepatitis C screening test 10/10/2016  . Dyslipidemia 05/13/2015  . Leukopenia 05/13/2015  . Family history of premature CAD 10/02/2012  . Benign essential hypertension 06/06/2011  . Colon polyps 06/01/2011    Roderic Scarce PT 03/01/2018, 12:29 PM  [facility] [address] [place], [place], [address] Phone: [phone]   Fax:  [phone]  Name: Mathew Carter MRN: [id number] Date of Birth: Mar 21, 1961

## 2018-03-06 ENCOUNTER — Ambulatory Visit (INDEPENDENT_AMBULATORY_CARE_PROVIDER_SITE_OTHER): Admitting: Physical Therapy

## 2018-03-06 ENCOUNTER — Encounter: Payer: Self-pay | Admitting: Physical Therapy

## 2018-03-06 DIAGNOSIS — M545 Low back pain, unspecified: Secondary | ICD-10-CM

## 2018-03-06 DIAGNOSIS — G8929 Other chronic pain: Secondary | ICD-10-CM | POA: Diagnosis not present

## 2018-03-06 DIAGNOSIS — M6281 Muscle weakness (generalized): Secondary | ICD-10-CM | POA: Diagnosis not present

## 2018-03-06 DIAGNOSIS — M62830 Muscle spasm of back: Secondary | ICD-10-CM

## 2018-03-06 NOTE — Therapy (Signed)
[place], [place], [address] Phone: [phone]   Fax:  [phone]  Physical Therapy Treatment  Patient Details  Name: Mathew Carter MRN: [id number] Date of Birth: 1961-02-22 Referring Provider:  Dr Lynne Leader   Encounter Date: 03/06/2018  PT End of Session - 03/06/18 1108    Visit Number  2    Number of Visits  10    Date for PT Re-Evaluation  04/05/18    PT Start Time  1108 in late    PT Stop Time  1147    PT Time Calculation (min)  39 min    Activity Tolerance  Patient tolerated treatment well       Past Medical History:  Diagnosis Date  . Hypertension     Past Surgical History:  Procedure Laterality Date  . FOOT SURGERY    . HEMORRHOID SURGERY      There were no vitals filed for this visit.  Subjective Assessment - 03/06/18 1109    Subjective  Pt feels like the back is loosening up with the exericise.     Patient Stated Goals  concerned about having intercourse and being able to work.     Currently in Pain?  Yes    Pain Score  8     Pain Location  Back    Pain Orientation  Mid    Pain Descriptors / Indicators  Aching;Tightness;Numbness    Pain Type  Chronic pain    Pain Onset  More than a month ago    Pain Frequency  Constant    Aggravating Factors   driving    Pain Relieving Factors  muscle rub                       [facility] Adult PT Treatment/Exercise - 03/06/18 0001      Lumbar Exercises: Stretches   Single Knee to Chest Stretch  2 reps;20 seconds;Left;Right    Lower Trunk Rotation  2 reps;20 seconds    Other Lumbar Stretch Exercise  good morning stretch supine    Other Lumbar Stretch Exercise  supine over black bolster then over yoga eggs in thoracic area      Lumbar Exercises: Supine   Bridge with [employer]  15 reps 2 sets    Bridge with clamshell  15 reps 2sets with blue band    Other Supine Lumbar Exercises  2x15 table tops with heel taps        Lumbar Exercises: Prone   Other Prone Lumbar Exercises  2x10 lower body lifts, VC for form, then upper body lifts with arms in "goal post" position             PT Education - 03/06/18 1131    Education Details  HEP progression    Person(s) Educated  Patient    Methods  Explanation;Demonstration;Handout    Comprehension  Returned demonstration;Verbalized understanding          PT Long Term Goals - 03/06/18 1118      PT LONG TERM GOAL #1   Title  I with HEP ( 04/05/18)     Status  On-going      PT LONG TERM GOAL #2   Title  improve FOTO =/< 31% limited (04/05/18)      Status  On-going      PT LONG TERM GOAL #3   Title  report =/> 75% reduction in pain with working  ( 04/05/18)  Status  On-going      PT LONG TERM GOAL #4   Title  demo good control of pelvis with abdominal strengthening (04/05/18)     Status  On-going      PT LONG TERM GOAL #5   Title  report improvement with intercourse /being able to maintain a strong core contracation per his goals/concerns ( 04/05/18)     Status  On-going            Plan - 03/06/18 1142    Clinical Impression Statement  this is Mathew Carter's second visit, no goals met yet,  He does report increased flexibility in his back.  Tolerated treatment well today and reported feeling more loose after treatment.     Rehab Potential  Good    PT Frequency  2x / week    PT Treatment/Interventions  Dry needling;Manual techniques;Moist Heat;Ultrasound;Cryotherapy;Electrical Stimulation;Therapeutic exercise;Therapeutic activities;Patient/family education    PT Next Visit Plan  cont with core work and possible manual work if muscles are still tight    Consulted and Agree with Plan of Care  Patient       Patient will benefit from skilled therapeutic intervention in order to improve the following deficits and impairments:  Pain, Improper body mechanics, Increased muscle spasms, Hypomobility, Decreased strength  Visit Diagnosis: Chronic  bilateral low back pain without sciatica  Muscle weakness (generalized)  Muscle spasm of back     Problem List Patient Active Problem List   Diagnosis Date Noted  . Back pain 02/21/2018  . ED (erectile dysfunction) 02/21/2018  . Abnormal glucose 10/11/2017  . Need for hepatitis C screening test 10/10/2016  . Dyslipidemia 05/13/2015  . Leukopenia 05/13/2015  . Family history of premature CAD 10/02/2012  . Benign essential hypertension 06/06/2011  . Colon polyps 06/01/2011    Jeral Pinch PT  03/06/2018, 11:46 AM  [facility] [place], [place], [address] Phone: [phone]   Fax:  [phone]  Name: Mathew Carter MRN: [id number] Date of Birth: Jun 01, 1961

## 2018-03-08 ENCOUNTER — Ambulatory Visit (INDEPENDENT_AMBULATORY_CARE_PROVIDER_SITE_OTHER): Admitting: Physical Therapy

## 2018-03-08 ENCOUNTER — Encounter: Payer: Self-pay | Admitting: Physical Therapy

## 2018-03-08 DIAGNOSIS — G8929 Other chronic pain: Secondary | ICD-10-CM

## 2018-03-08 DIAGNOSIS — M6281 Muscle weakness (generalized): Secondary | ICD-10-CM | POA: Diagnosis not present

## 2018-03-08 DIAGNOSIS — M62830 Muscle spasm of back: Secondary | ICD-10-CM | POA: Diagnosis not present

## 2018-03-08 DIAGNOSIS — M545 Low back pain, unspecified: Secondary | ICD-10-CM

## 2018-03-08 NOTE — Therapy (Signed)
[facility] [place] [address] [place], [place], [address] Phone: [phone]   Fax:  [phone]  Physical Therapy Treatment  Patient Details  Name: Mathew Carter MRN: [id number] Date of Birth: [birth year] Referring Provider:  Dr Clementeen Graham   Encounter Date: 03/08/2018  PT End of Session - 03/08/18 1625    Visit Number  3    Number of Visits  10    Date for PT Re-Evaluation  04/05/18    PT Start Time  1625 in late    PT Stop Time  1700    PT Time Calculation (min)  35 min    Activity Tolerance  Patient tolerated treatment well       Past Medical History:  Diagnosis Date  . Hypertension     Past Surgical History:  Procedure Laterality Date  . FOOT SURGERY    . HEMORRHOID SURGERY      There were no vitals filed for this visit.  Subjective Assessment - 03/08/18 1625    Subjective  [name] feels like the new exercise are helping a lot    Patient Stated Goals  concerned about having intercourse and being able to work.     Currently in Pain?  Yes    Pain Score  6     Pain Location  Back    Pain Orientation  Upper lumbar    Pain Descriptors / Indicators  Tightness    Pain Type  Chronic pain                       [facility] Adult PT Treatment/Exercise - 03/08/18 0001      Self-Care   Self-Care  Other Self-Care Comments    Other Self-Care Comments   foam roller to lumbar and thoracic paraspinals      Lumbar Exercises: Stretches   Other Lumbar Stretch Exercise  good morning stretch supine    Other Lumbar Stretch Exercise  supine over yoga eggs in thoracic area with therapist      Lumbar Exercises: Aerobic   UBE (Upper Arm Bike)  L3 x4' alt FWD/BWD, on upside BOSU ball      Lumbar Exercises: Supine   Basic Lumbar Stabilization  10 reps LTR using TA to lift legs, very challenging for pt    Other Supine Lumbar Exercises  on whole bolster, lat pulls with 7# in each hand, SLR each side with TA  contractions, dead bugs VC and some physical cues for correct form                  PT Long Term Goals - 03/06/18 1118      PT LONG TERM GOAL #1   Title  I with HEP ( 04/05/18)     Status  On-going      PT LONG TERM GOAL #2   Title  improve FOTO =/< 31% limited (04/05/18)      Status  On-going      PT LONG TERM GOAL #3   Title  report =/> 75% reduction in pain with working  ( 04/05/18)     Status  On-going      PT LONG TERM GOAL #4   Title  demo good control of pelvis with abdominal strengthening (04/05/18)     Status  On-going      PT LONG TERM GOAL #5   Title  report improvement with intercourse /being able to maintain a strong core contracation per his goals/concerns (  04/05/18)     Status  On-going            Plan - 03/08/18 1727    Clinical Impression Statement  Mathew Carter did well with strengthening exercise today, reported increased feelings of movement after treatment and some decreased pain.     PT Frequency  2x / week    PT Treatment/Interventions  Dry needling;Manual techniques;Moist Heat;Ultrasound;Cryotherapy;Electrical Stimulation;Therapeutic exercise;Therapeutic activities;Patient/family education    PT Next Visit Plan  progress HEP     Consulted and Agree with Plan of Care  Patient       Patient will benefit from skilled therapeutic intervention in order to improve the following deficits and impairments:  Pain, Improper body mechanics, Increased muscle spasms, Hypomobility, Decreased strength  Visit Diagnosis: Chronic bilateral low back pain without sciatica  Muscle weakness (generalized)  Muscle spasm of back     Problem List Patient Active Problem List   Diagnosis Date Noted  . Back pain 02/21/2018  . ED (erectile dysfunction) 02/21/2018  . Abnormal glucose 10/11/2017  . Need for hepatitis C screening test 10/10/2016  . Dyslipidemia 05/13/2015  . Leukopenia 05/13/2015  . Family history of premature CAD 10/02/2012  . Benign essential  hypertension 06/06/2011  . Colon polyps 06/01/2011    Roderic ScarceSusan Jaaliyah Lucatero PT  03/08/2018, 5:29 PM  [facility] [address] [place], [place], [address] Phone: [phone]   Fax:  [phone]  Name: Mathew Carter MRN: [id number] Date of Birth: 09-05-1961

## 2018-03-12 ENCOUNTER — Encounter: Payer: Self-pay | Admitting: Rehabilitative and Restorative Service Providers"

## 2018-03-12 ENCOUNTER — Ambulatory Visit (INDEPENDENT_AMBULATORY_CARE_PROVIDER_SITE_OTHER): Admitting: Rehabilitative and Restorative Service Providers"

## 2018-03-12 DIAGNOSIS — M545 Low back pain, unspecified: Secondary | ICD-10-CM

## 2018-03-12 DIAGNOSIS — G8929 Other chronic pain: Secondary | ICD-10-CM | POA: Diagnosis not present

## 2018-03-12 DIAGNOSIS — M6281 Muscle weakness (generalized): Secondary | ICD-10-CM

## 2018-03-12 DIAGNOSIS — M62830 Muscle spasm of back: Secondary | ICD-10-CM

## 2018-03-12 NOTE — Therapy (Signed)
[facility] [address] [place], [place], [address] Phone: [phone]   Fax:  [phone]  Physical Therapy Treatment  Patient Details  Name: Willaim Shengnthony Faith MRN: [id number] Date of Birth: March 14, 1961 Referring Provider: Dr Clementeen GrahamEvan Corey    Encounter Date: 03/12/2018  PT End of Session - 03/12/18 1112    Visit Number  4    Number of Visits  10    Date for PT Re-Evaluation  04/05/18    PT Start Time  1106    PT Stop Time  1149    PT Time Calculation (min)  43 min    Activity Tolerance  Patient tolerated treatment well       Past Medical History:  Diagnosis Date  . Hypertension     Past Surgical History:  Procedure Laterality Date  . FOOT SURGERY    . HEMORRHOID SURGERY      There were no vitals filed for this visit.  Subjective Assessment - 03/12/18 1110    Subjective  Trying to do his exercises but has not been to the gym in the past three days. Working on the exercises at home.     Currently in Pain?  Yes    Pain Score  5     Pain Location  Back    Pain Orientation  Upper    Pain Descriptors / Indicators  Tightness    Pain Type  Chronic pain    Pain Onset  More than a month ago    Pain Frequency  Constant         [facility] PT Assessment - 03/12/18 0001      Assessment   Medical Diagnosis  chronic LBP    Referring Provider  Dr Clementeen GrahamEvan Corey     Onset Date/Surgical Date  03/01/17    Hand Dominance  Right    Next MD Visit  5 weeks    Prior Therapy  none      AROM   Lumbar Flexion  to floor with pull in low back across the bottom, pulling with return to stand    Lumbar Extension  WNL, stiffness low back    Lumbar - Right Side Bend  85% with pulling     Lumbar - Left Side Bend  WNL    Lumbar - Right Rotation  WNL     Lumbar - Left Rotation  WNL      Flexibility   Hamstrings  supine SLR Rt 75, Lt 75                   [facility] Adult PT Treatment/Exercise - 03/12/18 0001      Lumbar  Exercises: Stretches   Passive Hamstring Stretch  2 reps;30 seconds supine with strap     Single Knee to Chest Stretch  2 reps;20 seconds;Left;Right    Lower Trunk Rotation  2 reps;20 seconds    Other Lumbar Stretch Exercise  child's pose 20 sec x 2     Other Lumbar Stretch Exercise  supine over yoga eggs in thoracic area ~1 min       Lumbar Exercises: Aerobic   UBE (Upper Arm Bike)  L4 x4' alt FWD/BWD, on upside BOSU ball      Lumbar Exercises: Standing   Other Standing Lumbar Exercises  body blade overhead 1 min x 2     Other Standing Lumbar Exercises  step back with blue TB - bow and arrow alternating sides - x 20 each side -  verbal and tactile cues required       Lumbar Exercises: Seated   Sit to Stand  10 reps slow controled return stand to sit       Lumbar Exercises: Supine   Bridge  10 reps bridge with leg extension in bridge position     Bridge with clamshell  20 reps blue TB     Other Supine Lumbar Exercises  on whole bolster, lat pulls with 7# in each hand, SLR each side with TA contractions, dead bugs VC and some physical cues for correct form      Lumbar Exercises: Sidelying   Clam  Right;Left;20 reps blue TB                   PT Long Term Goals - 03/12/18 1111      PT LONG TERM GOAL #1   Title  I with HEP ( 04/05/18)     Time  5    Period  Weeks    Status  On-going      PT LONG TERM GOAL #2   Title  improve FOTO =/< 31% limited (04/05/18)      Time  5    Period  Weeks    Status  On-going      PT LONG TERM GOAL #3   Title  report =/> 75% reduction in pain with working  ( 04/05/18)     Time  5    Period  Weeks    Status  On-going      PT LONG TERM GOAL #4   Title  demo good control of pelvis with abdominal strengthening (04/05/18)     Time  5    Period  Weeks    Status  On-going      PT LONG TERM GOAL #5   Title  report improvement with intercourse /being able to maintain a strong core contracation per his goals/concerns ( 04/05/18)     Time  5     Period  Weeks    Status  On-going            Plan - 03/12/18 1151    Clinical Impression Statement  Trunk mobility is increasing with less discomfort reported. Patient is working well with exercises in clinic and continuing with HEP and gym program. He is progressing well toward stated goals of therapy.     Rehab Potential  Good    PT Frequency  2x / week    PT Duration  -- 5 weeks     PT Treatment/Interventions  Dry needling;Manual techniques;Moist Heat;Ultrasound;Cryotherapy;Electrical Stimulation;Therapeutic exercise;Therapeutic activities;Patient/family education    PT Next Visit Plan  progress HEP     Consulted and Agree with Plan of Care  Patient       Patient will benefit from skilled therapeutic intervention in order to improve the following deficits and impairments:  Pain, Improper body mechanics, Increased muscle spasms, Hypomobility, Decreased strength  Visit Diagnosis: Chronic bilateral low back pain without sciatica  Muscle weakness (generalized)  Muscle spasm of back     Problem List Patient Active Problem List   Diagnosis Date Noted  . Back pain 02/21/2018  . ED (erectile dysfunction) 02/21/2018  . Abnormal glucose 10/11/2017  . Need for hepatitis C screening test 10/10/2016  . Dyslipidemia 05/13/2015  . Leukopenia 05/13/2015  . Family history of premature CAD 10/02/2012  . Benign essential hypertension 06/06/2011  . Colon polyps 06/01/2011    Kennedey Digilio Rober Minion PT, MPH  03/12/2018,  11:53 AM  [facility] [address] [place], [place], [address] Phone: [phone]   Fax:  [phone]  Name: Finas Delone MRN: [id number] Date of Birth: 08-Oct-1960

## 2018-03-16 ENCOUNTER — Encounter: Payer: Self-pay | Admitting: Rehabilitative and Restorative Service Providers"

## 2018-03-16 ENCOUNTER — Ambulatory Visit (INDEPENDENT_AMBULATORY_CARE_PROVIDER_SITE_OTHER): Admitting: Rehabilitative and Restorative Service Providers"

## 2018-03-16 DIAGNOSIS — M545 Low back pain, unspecified: Secondary | ICD-10-CM

## 2018-03-16 DIAGNOSIS — G8929 Other chronic pain: Secondary | ICD-10-CM

## 2018-03-16 DIAGNOSIS — M6281 Muscle weakness (generalized): Secondary | ICD-10-CM | POA: Diagnosis not present

## 2018-03-16 DIAGNOSIS — M62830 Muscle spasm of back: Secondary | ICD-10-CM

## 2018-03-16 NOTE — Therapy (Signed)
[facility] [place] [address] [place], [place], [address] Phone: [phone]   Fax:  [phone]  Physical Therapy Treatment  Patient Details  Name: Mathew Carter MRN: [id number] Date of Birth: 1961/05/14 Referring Provider: Dr Clementeen Graham    Encounter Date: 03/16/2018    Past Medical History:  Diagnosis Date  . Hypertension     Past Surgical History:  Procedure Laterality Date  . FOOT SURGERY    . HEMORRHOID SURGERY      There were no vitals filed for this visit.  Subjective Assessment - 03/16/18 1537    Subjective  Patient reports that he has some discomfort in the mid back area - he worked out in the gym the past 3 days and also push mowed the grass yesterday which was 1.5 hours.     Currently in Pain?  Yes    Pain Score  6     Pain Location  Back    Pain Orientation  Upper    Pain Descriptors / Indicators  Tightness    Pain Type  Chronic pain    Pain Onset  More than a month ago    Pain Frequency  Constant                       [facility] Adult PT Treatment/Exercise - 03/16/18 0001      Self-Care   Self-Care  Other Self-Care Comments    Other Self-Care Comments   sitting posture and alignment - maintaining core with neutral spine       Lumbar Exercises: Aerobic   Elliptical  L3 to L2 x 5 min decresaed resistance due to poor form with exercise       Lumbar Exercises: Standing   Other Standing Lumbar Exercises  body blade overhead 1 min x 2     Other Standing Lumbar Exercises  lateral toa heel on 6 inch step core tight 1 hand hold for balance       Lumbar Exercises: Seated   Sit to Stand  10 reps slow controled return stand to sit     Other Seated Lumbar Exercises  sitting ant/post pelvic tilt working on segmental mobility through lumbar spine       Lumbar Exercises: Supine   Bridge  10 reps engaging core     Bridge with [employer]  10 reps core engaged                   PT  Long Term Goals - 03/12/18 1111      PT LONG TERM GOAL #1   Title  I with HEP ( 04/05/18)     Time  5    Period  Weeks    Status  On-going      PT LONG TERM GOAL #2   Title  improve FOTO =/< 31% limited (04/05/18)      Time  5    Period  Weeks    Status  On-going      PT LONG TERM GOAL #3   Title  report =/> 75% reduction in pain with working  ( 04/05/18)     Time  5    Period  Weeks    Status  On-going      PT LONG TERM GOAL #4   Title  demo good control of pelvis with abdominal strengthening (04/05/18)     Time  5    Period  Weeks    Status  On-going      PT LONG TERM GOAL #5   Title  report improvement with intercourse /being able to maintain a strong core contracation per his goals/concerns ( 04/05/18)     Time  5    Period  Weeks    Status  On-going            Plan - 03/16/18 1616    Clinical Impression Statement  Patient reports improved bodily functioins since he has been working. Patient's questions about LBP and mid back pain addressed with use of spine model with explanation. Patient worked on postural control with core engaged. Continued with core stabilization and strengthening.     Rehab Potential  Good    PT Frequency  2x / week    PT Duration  -- 5 weeks     PT Treatment/Interventions  Dry needling;Manual techniques;Moist Heat;Ultrasound;Cryotherapy;Electrical Stimulation;Therapeutic exercise;Therapeutic activities;Patient/family education    PT Next Visit Plan  progress HEP     Consulted and Agree with Plan of Care  Patient       Patient will benefit from skilled therapeutic intervention in order to improve the following deficits and impairments:  Pain, Improper body mechanics, Increased muscle spasms, Hypomobility, Decreased strength  Visit Diagnosis: Chronic bilateral low back pain without sciatica  Muscle weakness (generalized)  Muscle spasm of back     Problem List Patient Active Problem List   Diagnosis Date Noted  . Back pain  02/21/2018  . ED (erectile dysfunction) 02/21/2018  . Abnormal glucose 10/11/2017  . Need for hepatitis C screening test 10/10/2016  . Dyslipidemia 05/13/2015  . Leukopenia 05/13/2015  . Family history of premature CAD 10/02/2012  . Benign essential hypertension 06/06/2011  . Colon polyps 06/01/2011    Kalesha Irving Rober MinionP Isys Tietje PT, MPH  03/16/2018, 4:21 PM  [facility] [address] [place], [place], [address] Phone: [phone]   Fax:  [phone]  Name: Mathew Carter MRN: [id number] Date of Birth: 28-Jul-1961

## 2018-03-26 ENCOUNTER — Encounter: Payer: Self-pay | Admitting: Rehabilitative and Restorative Service Providers"

## 2018-03-26 ENCOUNTER — Ambulatory Visit (INDEPENDENT_AMBULATORY_CARE_PROVIDER_SITE_OTHER): Admitting: Rehabilitative and Restorative Service Providers"

## 2018-03-26 DIAGNOSIS — M545 Low back pain, unspecified: Secondary | ICD-10-CM

## 2018-03-26 DIAGNOSIS — M6281 Muscle weakness (generalized): Secondary | ICD-10-CM | POA: Diagnosis not present

## 2018-03-26 DIAGNOSIS — M62830 Muscle spasm of back: Secondary | ICD-10-CM

## 2018-03-26 DIAGNOSIS — G8929 Other chronic pain: Secondary | ICD-10-CM

## 2018-03-26 NOTE — Therapy (Signed)
[facility] [place] [address] [place], [place], [address] Phone: [phone]   Fax:  [phone]  Physical Therapy Treatment  Patient Details  Name: Mathew Carter MRN: [id number] Date of Birth: Apr 28, 1961 Referring Provider: Dr Clementeen GrahamEvan Corey    Encounter Date: 03/26/2018  PT End of Session - 03/26/18 1119    Visit Number  5    Number of Visits  10    Date for PT Re-Evaluation  04/05/18    PT Start Time  1105    PT Stop Time  1153    PT Time Calculation (min)  48 min    Activity Tolerance  Patient tolerated treatment well       Past Medical History:  Diagnosis Date  . Hypertension     Past Surgical History:  Procedure Laterality Date  . FOOT SURGERY    . HEMORRHOID SURGERY      There were no vitals filed for this visit.  Subjective Assessment - 03/26/18 1120    Subjective  Patient reports that he is improving. He has less pain in the LB and can tell a difference when he does his exercises consistently.     Currently in Pain?  No/denies tightness in the back no pain         [facility] - 03/26/18 0001      Assessment   Medical Diagnosis  chronic LBP    Referring Provider  Dr Clementeen GrahamEvan Corey     Onset Date/Surgical Date  03/01/17    Hand Dominance  Right    Next MD Visit  5 weeks    Prior Therapy  none      AROM   Lumbar Flexion  to floor with pull in low back across the bottom, pulling with return to stand    Lumbar Extension  WNL, stiffness low back    Lumbar - Right Side Bend  85% with pulling     Lumbar - Left Side Bend  WNL    Lumbar - Right Rotation  WNL     Lumbar - Left Rotation  WNL      Flexibility   Hamstrings  supine SLR Rt 80, Lt 80                   [facility] Adult PT Treatment/Exercise - 03/26/18 0001      Lumbar Exercises: Stretches   Passive Hamstring Stretch  2 reps;30 seconds    Other Lumbar Stretch Exercise  child's pose 20 sec x 3      Lumbar Exercises: Supine   Bridge  10 reps engaging core     Bridge with [employer] Squeeze  10 reps core engaged - shifted hips Rt/Lt then down     Other Supine Lumbar Exercises  lat pulls with 7# in each hand x 20       Lumbar Exercises: Quadruped   Madcat/Old Horse  10 reps requiring verbal/tactile cues      Moist Heat Therapy   Number Minutes Moist Heat  10 Minutes    Moist Heat Location  Lumbar Spine             PT Education - 03/26/18 1143    Education Details  HEP     Person(s) Educated  Patient    Methods  Explanation;Demonstration;Tactile cues;Verbal cues;Handout    Comprehension  Verbalized understanding;Returned demonstration;Verbal cues required;Tactile cues required          PT Long Term Goals - 03/26/18 1119  PT LONG TERM GOAL #1   Title  I with HEP ( 04/05/18)     Time  5    Period  Weeks    Status  On-going      PT LONG TERM GOAL #2   Title  improve FOTO =/< 31% limited (04/05/18)      Time  5    Period  Weeks    Status  On-going      PT LONG TERM GOAL #3   Title  report =/> 75% reduction in pain with working  ( 04/05/18)     Time  5    Period  Weeks    Status  On-going      PT LONG TERM GOAL #4   Title  demo good control of pelvis with abdominal strengthening (04/05/18)     Time  5    Period  Weeks    Status  On-going      PT LONG TERM GOAL #5   Title  report improvement with intercourse /being able to maintain a strong core contracation per his goals/concerns ( 04/05/18)     Time  5    Period  Weeks    Status  On-going            Plan - 03/26/18 1127    Clinical Impression Statement  Conitnued improvement - especially with consistent HEP. Patient demonstrates improved exercise tolerance. Progressing well toward independent HEP.     Rehab Potential  Good    PT Frequency  2x / week    PT Duration  -- 5 weeks    PT Treatment/Interventions  Dry needling;Manual techniques;Moist Heat;Ultrasound;Cryotherapy;Electrical Stimulation;Therapeutic exercise;Therapeutic  activities;Patient/family education    PT Next Visit Plan  continue core stabilization; HEP     Consulted and Agree with Plan of Care  Patient       Patient will benefit from skilled therapeutic intervention in order to improve the following deficits and impairments:  Pain, Improper body mechanics, Increased muscle spasms, Hypomobility, Decreased strength  Visit Diagnosis: Chronic bilateral low back pain without sciatica  Muscle weakness (generalized)  Muscle spasm of back     Problem List Patient Active Problem List   Diagnosis Date Noted  . Back pain 02/21/2018  . ED (erectile dysfunction) 02/21/2018  . Abnormal glucose 10/11/2017  . Need for hepatitis C screening test 10/10/2016  . Dyslipidemia 05/13/2015  . Leukopenia 05/13/2015  . Family history of premature CAD 10/02/2012  . Benign essential hypertension 06/06/2011  . Colon polyps 06/01/2011    Mathew Carter PT, MPH  03/26/2018, 11:46 AM  [facility] [address] [place], [place], [address] Phone: [phone]   Fax:  [phone]  Name: Mathew Carter MRN: [id number] Date of Birth: [birth year]

## 2018-03-26 NOTE — Patient Instructions (Signed)
Cat / Cow Flow    Inhale, press spine toward ceiling like a Halloween cat. Keeping strength in arms and abdominals, exhale to soften spine through neutral and into cow pose. Open chest and arch back. Initiate movement between cat and cow at tailbone, one vertebrae at a time. Repeat __10__ times.   Side Waist Stretch from Child's Pose    From child's pose, walk hands to left. Reach right hand out on diagonal. Reach hips back toward heels making a C with torso. Breathe into right side waist. Hold for __20-30 secs. Repeat __3__ times each side.

## 2018-03-28 ENCOUNTER — Encounter: Admitting: Rehabilitative and Restorative Service Providers"

## 2018-04-02 ENCOUNTER — Encounter: Payer: Self-pay | Admitting: Physical Therapy

## 2018-04-02 ENCOUNTER — Ambulatory Visit (INDEPENDENT_AMBULATORY_CARE_PROVIDER_SITE_OTHER): Admitting: Physical Therapy

## 2018-04-02 ENCOUNTER — Ambulatory Visit (INDEPENDENT_AMBULATORY_CARE_PROVIDER_SITE_OTHER): Admitting: Family Medicine

## 2018-04-02 ENCOUNTER — Encounter: Payer: Self-pay | Admitting: Family Medicine

## 2018-04-02 VITALS — BP 150/87 | HR 62 | Ht 67.0 in | Wt 191.0 lb

## 2018-04-02 DIAGNOSIS — G8929 Other chronic pain: Secondary | ICD-10-CM

## 2018-04-02 DIAGNOSIS — M545 Low back pain, unspecified: Secondary | ICD-10-CM

## 2018-04-02 DIAGNOSIS — N529 Male erectile dysfunction, unspecified: Secondary | ICD-10-CM | POA: Diagnosis not present

## 2018-04-02 NOTE — Patient Instructions (Signed)
Thank you for coming in today. Continue PT anfd home exercises Recheck in 2 months.  Return sooner if needed.

## 2018-04-02 NOTE — Therapy (Signed)
[place], [place], [address] Phone: [phone]   Fax:  [phone]  Physical Therapy Re-eval/Treatment  Patient Details  Name: Mathew Carter MRN: [id number] Date of Birth: 1960/12/19 Referring Provider: Steva Colder   Encounter Date: 04/02/2018  PT End of Session - 04/02/18 0855    Visit Number  6    Number of Visits  10    Date for PT Re-Evaluation  04/16/18    PT Start Time  0800    PT Stop Time  0845    PT Time Calculation (min)  45 min    Activity Tolerance  Patient tolerated treatment well    Behavior During Therapy  [facility] for tasks assessed/performed       Past Medical History:  Diagnosis Date  . Hypertension     Past Surgical History:  Procedure Laterality Date  . FOOT SURGERY    . HEMORRHOID SURGERY      There were no vitals filed for this visit.  Subjective Assessment - 04/02/18 0812    Subjective  Pt reports that overall there is some improvement, especially if he consitently performs his exercise program    Patient Stated Goals  concerned about having intercourse and being able to work.     Currently in Pain?  Yes    Pain Score  5     Pain Location  Back    Pain Orientation  Lower Lt more pain than Rt    Pain Descriptors / Indicators  Tightness;Sharp    Pain Type  Chronic pain    Pain Onset  More than a month ago    Pain Frequency  Intermittent    Aggravating Factors   vacuuming, intercorse, driving but this has improved some    Pain Relieving Factors  muscel rub, HEP         [facility] PT Assessment - 04/02/18 0803      Assessment   Medical Diagnosis  chronic LBP    Referring Provider  Steva Colder    Onset Date/Surgical Date  03/01/17    Hand Dominance  Right    Next MD Visit  today    Prior Therapy  none      Observation/Other Assessments   Focus on Therapeutic Outcomes (FOTO)   48% limit      AROM   Lumbar Flexion  to floor with pull in low back across the  bottom, pulling with return to stand    Lumbar Extension  WNL, stiffness low back    Lumbar - Right Side Bend  -- WNL, slight pull    Lumbar - Left Side Bend  WNL    Lumbar - Right Rotation  WNL     Lumbar - Left Rotation  WNL      Flexibility   Hamstrings  supine SLR Rt 90, Lt 90                   [facility] Adult PT Treatment/Exercise - 04/02/18 0816      Lumbar Exercises: Stretches   Passive Hamstring Stretch  2 reps;30 seconds    Press Ups  10 reps;5 seconds    Other Lumbar Stretch Exercise  child's pose 20 sec x 3    Other Lumbar Stretch Exercise  Quadriped to cobra pose 5 secX10      Lumbar Exercises: Supine   Pelvic Tilt  20 reps    Clam  20 reps core engaged, green TB  Bridge  15 reps engaging core     Bridge with [employer]  15 reps core engaged - shifted hips Rt/Lt then down     Other Supine Lumbar Exercises  resisted hip flexion isometric 5 sec X 10 bilat      Lumbar Exercises: Quadruped   Madcat/Old Horse  10 reps requiring verbal/tactile cues    Straight Leg Raise  10 reps;5 seconds      Moist Heat Therapy   Moist Heat Location  Lumbar Spine      Manual Therapy   Manual Therapy  Soft tissue mobilization;Joint mobilization    Joint Mobilization  gentle lumbar central PA mobs    Soft tissue mobilization  lumbar P.S             PT Education - 04/02/18 0854    Education Details  New exercises today, POC edu    Person(s) Educated  Patient    Methods  Explanation;Demonstration;Tactile cues;Verbal cues    Comprehension  Need further instruction          PT Long Term Goals - 04/02/18 0858      PT LONG TERM GOAL #1   Title  I with HEP ( 04/05/18)     Time  5    Period  Weeks    Status  On-going      PT LONG TERM GOAL #2   Title  improve FOTO =/< 31% limited (04/05/18)      Time  5    Period  Weeks    Status  Partially Met      PT LONG TERM GOAL #3   Title  report =/> 75% reduction in pain with working  ( 04/05/18)     Time  5     Period  Weeks    Status  On-going      PT LONG TERM GOAL #4   Title  demo good control of pelvis with abdominal strengthening (04/05/18)     Time  5    Period  Weeks    Status  Partially Met      PT LONG TERM GOAL #5   Title  report improvement with intercourse /being able to maintain a strong core contracation per his goals/concerns ( 04/05/18)     Time  5    Period  Weeks    Status  Partially Met            Plan - 04/02/18 0856    Clinical Impression Statement  RE: Pt has attended 6 PT sessions making fair to good progress in all areas. He now has normal lumbar ROM and has improved SLR/hamstring flexabiliy to WNL although he does still have pulling sensation in Lumbar. He has increased core strength some but does still lack full strength and does still have some lumbar tightntess. He has partially met his long term goals but has not fully met them. He will benefit from continued PT to address these deficts and PT recommending 2 more weeks.     Clinical Presentation  Stable    Clinical Decision Making  Low    Rehab Potential  Good    PT Frequency  2x / week    PT Duration  6 weeks    PT Treatment/Interventions  Dry needling;Manual techniques;Moist Heat;Ultrasound;Cryotherapy;Electrical Stimulation;Therapeutic exercise;Therapeutic activities;Patient/family education    PT Next Visit Plan  continue core stabilization; manual therapy PRN    Consulted and Agree with Plan of Care  Patient  Patient will benefit from skilled therapeutic intervention in order to improve the following deficits and impairments:  Pain, Improper body mechanics, Increased muscle spasms, Hypomobility, Decreased strength  Visit Diagnosis: Chronic bilateral low back pain without sciatica     Problem List Patient Active Problem List   Diagnosis Date Noted  . Back pain 02/21/2018  . ED (erectile dysfunction) 02/21/2018  . Abnormal glucose 10/11/2017  . Need for hepatitis C screening test 10/10/2016   . Dyslipidemia 05/13/2015  . Leukopenia 05/13/2015  . Family history of premature CAD 10/02/2012  . Benign essential hypertension 06/06/2011  . Colon polyps 06/01/2011    Debbe Odea, PT, DPT 04/02/2018, 9:01 AM  [facility] [place], [place], [address] Phone: [phone]   Fax:  [phone]  Name: Mathew Carter MRN: [id number] Date of Birth: August 30, 1961

## 2018-04-02 NOTE — Progress Notes (Signed)
   Mathew Carter is a 57 y.o. male who [facility] today for follow-up back pain and erectile dysfunction.  Mathew Carter was seen about 6 weeks ago for low back pain and erectile dysfunction.  He denies any radiating pain weakness or numbness.  He has been receiving physical therapy and notes significant improvement in his pain.  He notes erectile dysfunction is also improving with time and Viagra.  He feels quite well.  He thinks he still has some progress to make in physical therapy but is very happy with how things are going.    ROS:  As above  Exam:  BP (!) 150/87   Pulse 62   Ht 5\' 7"  (1.702 m)   Wt 191 lb (86.6 kg)   BMI 29.91 kg/m  General: Well Developed, well nourished, and in no acute distress.  Neuro/Psych: Alert and oriented x3, extra-ocular muscles intact, able to move all 4 extremities, sensation grossly intact. Skin: Warm and dry, no rashes noted.  Respiratory: Not using accessory muscles, speaking in full sentences, trachea midline.  Cardiovascular: Pulses palpable, no extremity edema. Abdomen: Does not appear distended. MSK:  L-spine: Nontender normal motion normal gait.  Lower extremity strength is intact.    Lab and Radiology Results No results found for this or any previous visit (from the past 72 hour(s)). No results found.     Assessment and Plan: 57 y.o. male with back pain erectile dysfunction: Significantly improved.  Plan to continue physical therapy and Viagra.  Recheck in 2 months.  Return sooner if needed.  Continue home exercise program.    Orders Placed This Encounter  Procedures  . Ambulatory referral to Physical Therapy    Referral Priority:   Routine    Referral Type:   Physical Medicine    Referral Reason:   Specialty Services Required    Requested Specialty:   Physical Therapy   No orders of the defined types were placed in this encounter.   Historical information moved to  improve visibility of documentation.  Past Medical History:  Diagnosis Date  . Hypertension    Past Surgical History:  Procedure Laterality Date  . FOOT SURGERY    . HEMORRHOID SURGERY     Social History   Tobacco Use  . Smoking status: Never Smoker  . Smokeless tobacco: Never Used  Substance Use Topics  . Alcohol use: No    Frequency: Never    Comment: occ   family history includes Cancer in his mother; Diabetes in his father; Stroke in his father.  Medications: Current Outpatient Medications  Medication Sig Dispense Refill  . amLODipine (NORVASC) 10 MG tablet Take by mouth daily.    Marland Kitchen. aspirin 81 MG chewable tablet Chew by mouth.    . sildenafil (VIAGRA) 100 MG tablet Take 0.5-1 tablets (50-100 mg total) by mouth daily as needed for erectile dysfunction. 10 tablet 0  . valsartan-hydrochlorothiazide (DIOVAN-HCT) 160-12.5 MG tablet TAKE 1 TABLET DAILY     No current facility-administered medications for this visit.    No Known Allergies    Discussed warning signs or symptoms. Please see discharge instructions. Patient expresses understanding.

## 2018-04-06 ENCOUNTER — Ambulatory Visit (INDEPENDENT_AMBULATORY_CARE_PROVIDER_SITE_OTHER): Admitting: Physical Therapy

## 2018-04-06 ENCOUNTER — Encounter: Payer: Self-pay | Admitting: Physical Therapy

## 2018-04-06 DIAGNOSIS — M62830 Muscle spasm of back: Secondary | ICD-10-CM | POA: Diagnosis not present

## 2018-04-06 DIAGNOSIS — M545 Low back pain, unspecified: Secondary | ICD-10-CM

## 2018-04-06 DIAGNOSIS — M6281 Muscle weakness (generalized): Secondary | ICD-10-CM | POA: Diagnosis not present

## 2018-04-06 DIAGNOSIS — G8929 Other chronic pain: Secondary | ICD-10-CM

## 2018-04-06 NOTE — Therapy (Signed)
[place], [place], [address] Phone: [phone]   Fax:  [phone]  Physical Therapy Treatment  Patient Details  Name: Mathew Carter MRN: [id number] Date of Birth: 1961/01/13 Referring Provider: Steva Colder   Encounter Date: 04/06/2018  PT End of Session - 04/06/18 1154    Visit Number  7    Number of Visits  10    Date for PT Re-Evaluation  04/16/18    PT Start Time  1100    PT Stop Time  1145    PT Time Calculation (min)  45 min    Activity Tolerance  Patient tolerated treatment well    Behavior During Therapy  [facility] for tasks assessed/performed       Past Medical History:  Diagnosis Date  . Hypertension     Past Surgical History:  Procedure Laterality Date  . FOOT SURGERY    . HEMORRHOID SURGERY      There were no vitals filed for this visit.  Subjective Assessment - 04/06/18 1115    Subjective  Pt relays MD appt went well, he is supposed to continue PT 2 more weeks. He also mowed his grass and now has increased back pain from this    Patient Stated Goals  concerned about having intercourse and being able to work.     Currently in Pain?  Yes    Pain Score  6     Pain Location  Back                       [facility] Adult PT Treatment/Exercise - 04/06/18 1114      Lumbar Exercises: Stretches   Passive Hamstring Stretch  2 reps;30 seconds    Prone on Elbows Stretch  60 seconds;2 reps    Press Ups  20 reps    Other Lumbar Stretch Exercise  child's pose 30 sec x 3    Other Lumbar Stretch Exercise  Quadriped to cobra pose 5 secX10      Lumbar Exercises: Aerobic   Stationary Bike  5 min L3      Lumbar Exercises: Standing   Row  20 reps;Theraband core    Theraband Level (Row)  Level 3 (Green)    Shoulder Extension  20 reps;Theraband core    Theraband Level (Shoulder Extension)  Level 3 (Green)    Other Standing Lumbar Exercises  -- lat pull, 40 lbs with core X 20       Lumbar Exercises: Supine   Pelvic Tilt  20 reps    Clam  --    Bridge  --    Bridge with [employer]  20 reps core engaged - shifted hips Rt/Lt then down     Bridge with clamshell  20 reps Green    Other Supine Lumbar Exercises  resisted hip flexion isometric 5 sec X 10 bilat      Lumbar Exercises: Quadruped   Madcat/Old Horse  20 reps requiring verbal/tactile cues    Straight Leg Raise  --    Opposite Arm/Leg Raise  3 seconds;10 reps      Moist Heat Therapy   Moist Heat Location  --      Manual Therapy   Manual Therapy  Soft tissue mobilization;Joint mobilization    Joint Mobilization  grade 2-3 lumbar central PA mobs    Soft tissue mobilization  IASTM and STM lumbar P.S  PT Education - 04/06/18 1153    Education Details  Cuing for core engagement and breathing during therex    Person(s) Educated  Patient    Methods  Explanation;Verbal cues    Comprehension  Verbalized understanding          PT Long Term Goals - 04/02/18 0858      PT LONG TERM GOAL #1   Title  I with HEP ( 04/05/18)     Time  5    Period  Weeks    Status  On-going      PT LONG TERM GOAL #2   Title  improve FOTO =/< 31% limited (04/05/18)      Time  5    Period  Weeks    Status  Partially Met      PT LONG TERM GOAL #3   Title  report =/> 75% reduction in pain with working  ( 04/05/18)     Time  5    Period  Weeks    Status  On-going      PT LONG TERM GOAL #4   Title  demo good control of pelvis with abdominal strengthening (04/05/18)     Time  5    Period  Weeks    Status  Partially Met      PT LONG TERM GOAL #5   Title  report improvement with intercourse /being able to maintain a strong core contracation per his goals/concerns ( 04/05/18)     Time  5    Period  Weeks    Status  Partially Met            Plan - 04/06/18 1154    Clinical Impression Statement  Pt was able to progress his core stregthening and back strengthening program with good tolerance and  without complaints. He does still haave siffness in lumbar and was treated with stretching, mobilizations, and STM/IASTM to decrease stiffness and pain. Pt relays he felt good after session and declined modalties. Continue poc.    Rehab Potential  Good    PT Frequency  2x / week    PT Duration  6 weeks    PT Treatment/Interventions  Dry needling;Manual techniques;Moist Heat;Ultrasound;Cryotherapy;Electrical Stimulation;Therapeutic exercise;Therapeutic activities;Patient/family education    PT Next Visit Plan  continue core stabilization; manual therapy PRN    Consulted and Agree with Plan of Care  Patient       Patient will benefit from skilled therapeutic intervention in order to improve the following deficits and impairments:  Pain, Improper body mechanics, Increased muscle spasms, Hypomobility, Decreased strength  Visit Diagnosis: Chronic bilateral low back pain without sciatica  Muscle weakness (generalized)  Muscle spasm of back     Problem List Patient Active Problem List   Diagnosis Date Noted  . Back pain 02/21/2018  . ED (erectile dysfunction) 02/21/2018  . Abnormal glucose 10/11/2017  . Need for hepatitis C screening test 10/10/2016  . Dyslipidemia 05/13/2015  . Leukopenia 05/13/2015  . Family history of premature CAD 10/02/2012  . Benign essential hypertension 06/06/2011  . Colon polyps 06/01/2011    Debbe Odea, PT, DPT 04/06/2018, 11:57 AM  [facility] [place], [place], [address] Phone: [phone]   Fax:  [phone]  Name: Mathew Carter MRN: [id number] Date of Birth: 1961-03-06

## 2018-04-11 ENCOUNTER — Encounter: Admitting: Physical Therapy

## 2018-04-16 ENCOUNTER — Encounter: Admitting: Physical Therapy

## 2018-04-19 ENCOUNTER — Ambulatory Visit (INDEPENDENT_AMBULATORY_CARE_PROVIDER_SITE_OTHER): Admitting: Rehabilitative and Restorative Service Providers"

## 2018-04-19 DIAGNOSIS — G8929 Other chronic pain: Secondary | ICD-10-CM | POA: Diagnosis not present

## 2018-04-19 DIAGNOSIS — M6281 Muscle weakness (generalized): Secondary | ICD-10-CM

## 2018-04-19 DIAGNOSIS — M545 Low back pain, unspecified: Secondary | ICD-10-CM

## 2018-04-19 DIAGNOSIS — M62830 Muscle spasm of back: Secondary | ICD-10-CM

## 2018-04-19 NOTE — Therapy (Addendum)
[place], [place], [address] Phone: [phone]   Fax:  [phone]  Physical Therapy Treatment  Patient Details  Name: Mathew Carter MRN: [id number] Date of Birth: [birth year] Referring Provider: Steva Colder   Encounter Date: 04/19/2018  PT End of Session - 04/19/18 1455    Visit Number  8    Number of Visits  10    Date for PT Re-Evaluation  04/27/18    PT Start Time  2992    PT Stop Time  1537    PT Time Calculation (min)  49 min    Activity Tolerance  Patient tolerated treatment well       Past Medical History:  Diagnosis Date  . Hypertension     Past Surgical History:  Procedure Laterality Date  . FOOT SURGERY    . HEMORRHOID SURGERY      There were no vitals filed for this visit.  Subjective Assessment - 04/19/18 1454    Subjective  Patient reports that he continues to have stiffness and tightness in the low back - when he is standing.     Currently in Pain?  Yes    Pain Score  7     Pain Location  Back    Pain Orientation  Lower    Pain Descriptors / Indicators  Tightness    Pain Type  Chronic pain                       [facility] Adult PT Treatment/Exercise - 04/19/18 0001      Lumbar Exercises: Stretches   Passive Hamstring Stretch  2 reps;30 seconds    Prone on Elbows Stretch  60 seconds;2 reps    Press Ups  10 reps;5 seconds    Other Lumbar Stretch Exercise  child's pose 30 sec x 3    Other Lumbar Stretch Exercise  Quadriped to cobra pose 5 secX10      Lumbar Exercises: Aerobic   Stationary Bike  5 min L3      Lumbar Exercises: Seated   Other Seated Lumbar Exercises  forward flexion sitting to stretch low back - 10 sec x 5 reps       Lumbar Exercises: Supine   Bridge  10 reps core engaged     Bridge with [employer]  10 reps core engaged - shifted hips Rt/Lt then down     Bridge with clamshell  10 reps [employer] with March  10 reps verbal cue  to engage core    Isometric Hip Flexion  10 reps;5 seconds VC to engage core       Lumbar Exercises: Prone   Opposite Arm/Leg Raise  Right arm/Left leg;Left arm/Right leg;10 reps;1 second      Lumbar Exercises: Quadruped   Madcat/Old Horse  20 reps requiring verbal/tactile cues    Opposite Arm/Leg Raise  Right arm/Left leg;Left arm/Right leg;10 reps;3 seconds      Manual Therapy   Manual Therapy  Soft tissue mobilization;Joint mobilization    Joint Mobilization  grade 2-3 lumbar central PA mobs    Soft tissue mobilization  IASTM and STM lumbar P.S                  PT Long Term Goals - 04/19/18 1456      PT LONG TERM GOAL #1   Title  I with HEP ( 04/05/18)     Time  5  Period  Weeks    Status  On-going      PT LONG TERM GOAL #2   Title  improve FOTO =/< 31% limited (04/05/18)      Time  5    Period  Weeks    Status  Partially Met      PT LONG TERM GOAL #3   Title  report =/> 75% reduction in pain with working  ( 04/05/18)     Time  5    Period  Weeks    Status  On-going      PT LONG TERM GOAL #4   Title  demo good control of pelvis with abdominal strengthening (04/05/18)     Time  5    Period  Weeks    Status  Partially Met      PT LONG TERM GOAL #5   Title  report improvement with intercourse /being able to maintain a strong core contracation per his goals/concerns ( 04/05/18)     Time  5    Period  Weeks    Status  Partially Met            Plan - 04/19/18 1541    Clinical Impression Statement  Mathew Carter continues to reports some stiffness and tightness in the low back but does state that he has improved since beginning PT. He has less pain and improved lumbar mobility. He deomostrates increased lumbar mobility with cat/camel and stretches performed in the clinic today. Patient has increased exercise tolerance and endurance. He has muscular tightness to palpation through lumbar paraspinals. Patient continues to progress toward stated goals of therapy and  will benefit form an additional visit to work toward independence in HEP     Rehab Potential  Good    PT Frequency  2x / week    PT Duration  6 weeks    PT Treatment/Interventions  Dry needling;Manual techniques;Moist Heat;Ultrasound;Cryotherapy;Electrical Stimulation;Therapeutic exercise;Therapeutic activities;Patient/family education    PT Next Visit Plan  continue core stabilization; manual therapy PRN    Consulted and Agree with Plan of Care  Patient       Patient will benefit from skilled therapeutic intervention in order to improve the following deficits and impairments:  Pain, Improper body mechanics, Increased muscle spasms, Hypomobility, Decreased strength  Visit Diagnosis: Chronic bilateral low back pain without sciatica - Plan: PT plan of care cert/re-cert  Muscle weakness (generalized) - Plan: PT plan of care cert/re-cert  Muscle spasm of back - Plan: PT plan of care cert/re-cert     Problem List Patient Active Problem List   Diagnosis Date Noted  . Back pain 02/21/2018  . ED (erectile dysfunction) 02/21/2018  . Abnormal glucose 10/11/2017  . Need for hepatitis C screening test 10/10/2016  . Dyslipidemia 05/13/2015  . Leukopenia 05/13/2015  . Family history of premature CAD 10/02/2012  . Benign essential hypertension 06/06/2011  . Colon polyps 06/01/2011    Mathew Carter Nilda Simmer PT, MPH  04/19/2018, 3:47 PM  [facility] [place], [place], [address] Phone: [phone]   Fax:  [phone]  Name: Mathew Carter MRN: [id number] Date of Birth: 10-15-1960

## 2018-04-19 NOTE — Addendum Note (Signed)
Addended by: Val RilesHOLT, Anetha Slagel P on: 04/19/2018 03:48 PM   Modules accepted: Orders

## 2018-04-20 ENCOUNTER — Ambulatory Visit (INDEPENDENT_AMBULATORY_CARE_PROVIDER_SITE_OTHER): Admitting: Physical Therapy

## 2018-04-20 DIAGNOSIS — M6281 Muscle weakness (generalized): Secondary | ICD-10-CM

## 2018-04-20 DIAGNOSIS — M62830 Muscle spasm of back: Secondary | ICD-10-CM | POA: Diagnosis not present

## 2018-04-20 DIAGNOSIS — G8929 Other chronic pain: Secondary | ICD-10-CM

## 2018-04-20 DIAGNOSIS — M545 Low back pain, unspecified: Secondary | ICD-10-CM

## 2018-04-20 NOTE — Therapy (Addendum)
[place], [place], [address] Phone: [phone]   Fax:  [phone]  Physical Therapy Treatment  Patient Details  Name: Mathew Carter MRN: [id number] Date of Birth: Jun 14, 1961 Referring Provider: Steva Colder   Encounter Date: 04/20/2018  PT End of Session - 04/20/18 1537    Visit Number  9    Number of Visits  10    Date for PT Re-Evaluation  04/27/18    PT Start Time  1450    PT Stop Time  1535    PT Time Calculation (min)  45 min    Activity Tolerance  Patient tolerated treatment well    Behavior During Therapy  [facility] for tasks assessed/performed       Past Medical History:  Diagnosis Date  . Hypertension     Past Surgical History:  Procedure Laterality Date  . FOOT SURGERY    . HEMORRHOID SURGERY      There were no vitals filed for this visit.  Subjective Assessment - 04/20/18 1518    Subjective  Pt relays tightness in his back    Currently in Pain?  Yes    Pain Location  Back    Pain Orientation  Lower    Pain Descriptors / Indicators  Tightness    Pain Type  Chronic pain                       [facility] Adult PT Treatment/Exercise - 04/20/18 0001      Neuro Re-ed    Neuro Re-ed Details   core strength      Exercises   Exercises  Lumbar      Lumbar Exercises: Stretches   Passive Hamstring Stretch  2 reps;30 seconds    Single Knee to Chest Stretch  2 reps;20 seconds;Left;Right    Lower Trunk Rotation  3 reps;10 seconds    Press Ups  15 reps;5 seconds    Quadruped Mid Back Stretch  30 seconds;2 reps fwd, lt, rt      Lumbar Exercises: Aerobic   Stationary Bike  10 min L3 with heat for warmup      Lumbar Exercises: Supine   Bridge  10 reps;5 seconds with knee ext,      Lumbar Exercises: Quadruped   Opposite Arm/Leg Raise  Right arm/Left leg;Left arm/Right leg;3 seconds;15 reps    Other Quadruped Lumbar Exercises  plank on knees 30 secX3      Moist Heat  Therapy   Number Minutes Moist Heat  10 Minutes while on bike    Moist Heat Location  Lumbar Spine      Manual Therapy   Manual Therapy  Soft tissue mobilization;Joint mobilization    Joint Mobilization  grade 2-3 lumbar central PA mobs    Soft tissue mobilization  STM lumbar P.S                  PT Long Term Goals - 04/19/18 1456      PT LONG TERM GOAL #1   Title  I with HEP ( 04/05/18)     Time  5    Period  Weeks    Status  On-going      PT LONG TERM GOAL #2   Title  improve FOTO =/< 31% limited (04/05/18)      Time  5    Period  Weeks    Status  Partially Met      PT LONG  TERM GOAL #3   Title  report =/> 75% reduction in pain with working  ( 04/05/18)     Time  5    Period  Weeks    Status  On-going      PT LONG TERM GOAL #4   Title  demo good control of pelvis with abdominal strengthening (04/05/18)     Time  5    Period  Weeks    Status  Partially Met      PT LONG TERM GOAL #5   Title  report improvement with intercourse /being able to maintain a strong core contracation per his goals/concerns ( 04/05/18)     Time  5    Period  Weeks    Status  Partially Met            Plan - 04/20/18 1538    Clinical Impression Statement  Pt able to progress core strength today by adding in modified plank from knees. Session focused on core strength and continued stretching as he still has minor deficits in these. STM performed to reduce tension in lumbar P.S.  He has one more visit for his POC so plan to D/C then    Rehab Potential  Good    PT Frequency  2x / week    PT Duration  6 weeks    PT Next Visit Plan  D/C?    Consulted and Agree with Plan of Care  Patient       Patient will benefit from skilled therapeutic intervention in order to improve the following deficits and impairments:  Pain, Improper body mechanics, Increased muscle spasms, Hypomobility, Decreased strength  Visit Diagnosis: Chronic bilateral low back pain without sciatica  Muscle  weakness (generalized)  Muscle spasm of back     Problem List Patient Active Problem List   Diagnosis Date Noted  . Back pain 02/21/2018  . ED (erectile dysfunction) 02/21/2018  . Abnormal glucose 10/11/2017  . Need for hepatitis C screening test 10/10/2016  . Dyslipidemia 05/13/2015  . Leukopenia 05/13/2015  . Family history of premature CAD 10/02/2012  . Benign essential hypertension 06/06/2011  . Colon polyps 06/01/2011    Debbe Odea, PT, DPT 04/20/2018, 3:46 PM  [facility] [place], [place], [address] Phone: [phone]   Fax:  [phone]  Name: Mathew Carter MRN: [id number] Date of Birth: 1961/02/21  PHYSICAL THERAPY DISCHARGE SUMMARY  Visits from Start of Care: 9  Current functional level related to goals / functional outcomes: See progress note for discharge status   Remaining deficits: Unknown    Education / Equipment: HEP  Plan: Patient agrees to discharge.  Patient goals were met. Patient is being discharged due to being pleased with the current functional level.  ?????     Celyn P. Helene Kelp PT, MPH 05/30/18 12:03 PM

## 2018-06-04 ENCOUNTER — Encounter: Payer: Self-pay | Admitting: Family Medicine

## 2018-06-04 ENCOUNTER — Ambulatory Visit (INDEPENDENT_AMBULATORY_CARE_PROVIDER_SITE_OTHER): Admitting: Family Medicine

## 2018-06-04 VITALS — BP 160/97 | HR 62 | Ht 67.0 in | Wt 188.0 lb

## 2018-06-04 DIAGNOSIS — N529 Male erectile dysfunction, unspecified: Secondary | ICD-10-CM

## 2018-06-04 DIAGNOSIS — M545 Low back pain, unspecified: Secondary | ICD-10-CM

## 2018-06-04 DIAGNOSIS — G8929 Other chronic pain: Secondary | ICD-10-CM

## 2018-06-04 MED ORDER — TADALAFIL 20 MG PO TABS: 10.0000 mg | ORAL_TABLET | ORAL | 11 refills | Status: AC | PRN

## 2018-06-04 NOTE — Progress Notes (Signed)
Mathew Carter is a 57 y.o. male who [facility] today for back pain and erectile dysfunction.   Back pain: he has been seen several times for back pain. He says that his pain continues to improve with physical therapy. He does report that he still has pain when he stands too long. The pain does not radiate and he denies numbness or tingling. He is not currently taking anything for the pain. He does do some core exercises that he says have been helping.   ED: he says his ED is not improving. He tried Viagra one time and it did not help so he did not take it again. He says that the core exercises that he does for his back also seem to help with the ED. He is interested in trying Cialis instead of Viagra.  He is worried about the possibility of low testosterone.  He denies any known numbness or bowel bladder dysfunction.   ROS as above:  Exam:  BP (!) 160/97   Pulse 62   Ht 5\' 7"  (1.702 m)   Wt 188 lb (85.3 kg)   BMI 29.44 kg/m  Wt Readings from Last 5 Encounters:  06/04/18 188 lb (85.3 kg)  04/02/18 191 lb (86.6 kg)  02/21/18 190 lb (86.2 kg)  11/06/17 189 lb (85.7 kg)  09/22/17 178 lb (80.7 kg)    Gen: Well NAD HEENT: EOMI,  MMM Lungs: Normal work of breathing. CTABL Heart: RRR no MRG Abd: NABS, Soft. Nondistended, Nontender Exts: Brisk capillary refill, warm and well perfused.  L spine: normal appearing with no obvious deformities Non tender to palpation at the spinal midline.  Normal ROM with extension, flexion and rotation Normal strength Negative slump test Normal gait   Lab and Radiology Results MRI Lspine: Final Report  CLINICAL DATA: Low back pain extending to the right lower extremity with numbness, tingling, weakness for 6 months.  EXAM: MRI LUMBAR SPINE WITHOUT CONTRAST  TECHNIQUE: Multiplanar, multisequence MR imaging of  the lumbar spine was performed. No intravenous contrast was administered.  COMPARISON: None.  FINDINGS: Segmentation: 5 non rib-bearing lumbar type vertebral bodies are present.  Alignment: Slight degenerative retrolisthesis is present at L4-5. Slight degenerative anterolisthesis is present at L5-S1.  Vertebrae: Mild endplate marrow changes are noted on the right at L5-S1.  Conus medullaris: Extends to the L1 level and appears normal.  Paraspinal and other soft tissues: A simple cysts is partially imaged in the medial right kidney. No other focal lesions are present. There is no significant adenopathy.  Disc levels:  L1-2: Moderate facet hypertrophy and short pedicles are present. There is mild disc bulging. This results in mild right foraminal narrowing.  L2-3: Moderate facet hypertrophy and short pedicles are noted. Mild disc bulging is present. No focal stenosis is evident.  L3-4: Mild facet hypertrophy and short pedicles are present. Mild disc bulging is present without significant stenosis.  L4-5: A broad-based disc protrusion is asymmetric to the left. Mild facet hypertrophy is worse on the left. This results in mild left subarticular and foraminal narrowing.  L5-S1: A shallow central disc protrusion is present. Moderate facet hypertrophy and spurring is worse on the right. Mild right subarticular and moderate right foraminal narrowing is present.  IMPRESSION: 1. 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 2. Mild right foraminal narrowing at L1-2. 3. Mild left subarticular and foraminal narrowing at L4-5. 4. The most  prominent disease is at L5-S1 with mild right subarticular and moderate right foraminal stenosis. 5. Multilevel facet hypertrophy and short pedicles as described.   Electronically Signed By: Marin Roberts M.D. On: 04/12/2017 07:17  Principal Interpreter Name: Marin Roberts Provider ID:  Joella Prince  I personally (independently) visualized and performed the interpretation of the images attached in this note.    Assessment and Plan: 57 y.o. male with back pain and ED.   Back pain: Improving with physical therapy and home exercises. His pain is still present buy under control.  Offered facet injections.  Patient declined.  The plan will be to continue to do the core exercises as he is improving.   ED: he is more concerned about his ED today than his back pain. He seems to be very frustrated and looking for something that helps. He says the core exercises help but the Viagra did not. The plan will be to start Cialis and also to check his testosterone.  Will prescribe Cialis but ultimately transfer ED care to PCP.  Doubtful that his erectile dysfunction has much to do with his back at all however patient is very worried that his back pain and erectile dysfunction are connected.   Orders Placed This Encounter  Procedures  . Testosterone Total,Free,Bio, Males   Meds ordered this encounter  Medications  . tadalafil (ADCIRCA/CIALIS) 20 MG tablet    Sig: Take 0.5-1 tablets (10-20 mg total) by mouth every other day as needed for erectile dysfunction.    Dispense:  30 tablet    Refill:  11     Historical information moved to improve visibility of documentation.  Past Medical History:  Diagnosis Date  . Hypertension    Past Surgical History:  Procedure Laterality Date  . FOOT SURGERY    . HEMORRHOID SURGERY     Social History   Tobacco Use  . Smoking status: Never Smoker  . Smokeless tobacco: Never Used  Substance Use Topics  . Alcohol use: No    Frequency: Never    Comment: occ   family history includes Cancer in his mother; Diabetes in his father; Stroke in his father.  Medications: Current Outpatient Medications  Medication Sig Dispense Refill  . aspirin 81 MG chewable tablet Chew by mouth.    . Ginger, Zingiber officinalis, (GINGER PO) Take by mouth.    .  Misc Natural Products (APPLE CIDER VINEGAR DIET PO) Take by mouth.    . tadalafil (ADCIRCA/CIALIS) 20 MG tablet Take 0.5-1 tablets (10-20 mg total) by mouth every other day as needed for erectile dysfunction. 30 tablet 11   No current facility-administered medications for this visit.    No Known Allergies   Discussed warning signs or symptoms. Please see discharge instructions. Patient expresses understanding.  I personally was present and performed or re-performed the history, physical exam and medical decision-making activities of this service and have verified that the service and findings are accurately documented in the student's note. ___________________________________________ Clementeen Graham M.D., ABFM., CAQSM. Primary Care and Sports Medicine Adjunct Instructor of Family Medicine  [employer]

## 2018-06-04 NOTE — Patient Instructions (Signed)
Thank you for coming in today. Continue home exercise for back.  Get fasting testosterone labs soon.  Get labs before 10am Try Cialis.  Recheck as needed.

## 2018-06-06 LAB — TESTOSTERONE TOTAL,FREE,BIO, MALES
ALBUMIN MSPROF: 4 g/dL (ref 3.6–5.1)
SEX HORMONE BINDING: 33 nmol/L (ref 22–77)
TESTOSTERONE FREE: 60.8 pg/mL (ref 46.0–224.0)
Testosterone, Bioavailable: 111.8 ng/dL (ref >=110.0)
Testosterone: 443 ng/dL (ref 250–827)

## 2019-12-16 ENCOUNTER — Ambulatory Visit

## 2021-03-11 ENCOUNTER — Emergency Department (INDEPENDENT_AMBULATORY_CARE_PROVIDER_SITE_OTHER)
Admission: EM | Admit: 2021-03-11 | Discharge: 2021-03-11 | Disposition: A | Discharge status: Home / Self Care | Payer: Self-pay | Source: Home / Self Care | Attending: Family Medicine | Admitting: Family Medicine

## 2021-03-11 ENCOUNTER — Other Ambulatory Visit: Payer: Self-pay

## 2021-03-11 DIAGNOSIS — S143XXA Injury of brachial plexus, initial encounter: Secondary | ICD-10-CM

## 2021-03-11 MED ORDER — PREDNISONE 20 MG PO TABS: ORAL_TABLET | ORAL | 0 refills | Status: DC
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
## 2021-03-11 NOTE — Discharge Instructions (Addendum)
May take Tylenol as needed for pain.  Apply ice pack to right shoulder and neck area 2 to 3 times daily.  Avoid strenuous activities involving the right arm and heavy lifting.
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
## 2021-03-29 ENCOUNTER — Other Ambulatory Visit: Payer: Self-pay

## 2021-03-29 ENCOUNTER — Ambulatory Visit (INDEPENDENT_AMBULATORY_CARE_PROVIDER_SITE_OTHER): Payer: Self-pay | Admitting: Sports Medicine

## 2021-03-29 ENCOUNTER — Ambulatory Visit (INDEPENDENT_AMBULATORY_CARE_PROVIDER_SITE_OTHER)

## 2021-03-29 ENCOUNTER — Encounter: Payer: Self-pay | Admitting: Sports Medicine

## 2021-03-29 DIAGNOSIS — M5412 Radiculopathy, cervical region: Secondary | ICD-10-CM | POA: Insufficient documentation

## 2021-03-29 MED ORDER — PREDNISONE 10 MG (48) PO TBPK: ORAL_TABLET | Freq: Every day | ORAL | 0 refills | Status: AC
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
## 2021-04-01 ENCOUNTER — Ambulatory Visit: Admitting: Rehabilitative and Restorative Service Providers"

## 2021-04-05 ENCOUNTER — Other Ambulatory Visit: Payer: Self-pay

## 2021-04-05 ENCOUNTER — Encounter: Payer: Self-pay | Admitting: Rehabilitative and Restorative Service Providers"

## 2021-04-05 ENCOUNTER — Ambulatory Visit (INDEPENDENT_AMBULATORY_CARE_PROVIDER_SITE_OTHER): Admitting: Rehabilitative and Restorative Service Providers"

## 2021-04-05 DIAGNOSIS — R293 Abnormal posture: Secondary | ICD-10-CM | POA: Diagnosis not present

## 2021-04-05 DIAGNOSIS — M5412 Radiculopathy, cervical region: Secondary | ICD-10-CM

## 2021-04-05 DIAGNOSIS — R29898 Other symptoms and signs involving the musculoskeletal system: Secondary | ICD-10-CM | POA: Diagnosis not present
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
## 2021-04-05 NOTE — Patient Instructions (Signed)
Access Code: [email or web address]: [email or web address]: 07/18/2022Prepared by: Marx Doig HoltExercises  Seated Cervical Retraction - 3 x daily - 7 x weekly - 10 reps - 1 sets  Standing Scapular Retraction - 3 x daily - 7 x weekly - 10 reps - 1 sets - 10 hold  Shoulder External Rotation and Scapular Retraction - 3 x daily - 7 x weekly - 10 reps - 1 sets - hold  Shoulder External Rotation in 45 Degrees Abduction - 2 x daily - 7 x weekly - 1-2 sets - 10 reps - 3 sec hold  Doorway Pec Stretch at 60 Degrees Abduction - 3 x daily - 7 x weekly - 3 reps - 1 sets  Doorway Pec Stretch at 90 Degrees Abduction - 3 x daily - 7 x weekly - 3 reps - 1 sets - 30 seconds hold  Doorway Pec Stretch at 120 Degrees Abduction - 3 x daily - 7 x weekly - 3 reps - 1 sets - 30 second hold hold Patient Education  Hospital doctor  Office Posture

## 2021-04-08 ENCOUNTER — Ambulatory Visit (INDEPENDENT_AMBULATORY_CARE_PROVIDER_SITE_OTHER): Admitting: Physical Therapy

## 2021-04-08 ENCOUNTER — Other Ambulatory Visit: Payer: Self-pay

## 2021-04-08 DIAGNOSIS — R293 Abnormal posture: Secondary | ICD-10-CM

## 2021-04-08 DIAGNOSIS — M5412 Radiculopathy, cervical region: Secondary | ICD-10-CM

## 2021-04-08 DIAGNOSIS — R29898 Other symptoms and signs involving the musculoskeletal system: Secondary | ICD-10-CM

## 2021-04-08 NOTE — Therapy (Signed)
[facility] [place] [address] [place], [place], [address] Phone: [phone]   Fax:  [phone]  Physical Therapy Treatment  Patient Details  Name: Mathew Carter MRN: [id number] Date of Birth: 1960/12/24 Referring Provider (PT): Dr Benjamin Stain   Encounter Date: 04/08/2021   PT End of Session - 04/08/21 1753     Visit Number 2    Number of Visits 12    Date for PT Re-Evaluation 05/17/21    PT Start Time 1705    PT Stop Time 1750    PT Time Calculation (min) 45 min    Activity Tolerance Patient tolerated treatment well    Behavior During Therapy [facility] for tasks assessed/performed             Past Medical History:  Diagnosis Date   Hypertension     Past Surgical History:  Procedure Laterality Date   FOOT SURGERY     HEMORRHOID SURGERY      There were no vitals filed for this visit.   Subjective Assessment - 04/08/21 1709     Subjective Pt reports he is doing his exercises 1x/day.  They are helping a little bit.    Patient Stated Goals get the arm feeling right again    Currently in Pain? Yes    Pain Score 6     Pain Location Arm    Pain Orientation Right    Pain Descriptors / Indicators Tingling    Pain Radiating Towards Rt arm into 5th digit    Aggravating Factors  driving, sleeping position    Pain Relieving Factors medicine, some machines at [employer].                [facility] PT Assessment - 04/08/21 0001       Assessment   Medical Diagnosis Rt Cervical radiculopathy    Referring Provider (PT) Dr Benjamin Stain    Onset Date/Surgical Date 03/05/21    Hand Dominance Right    Next MD Visit 04/27/21    Prior Therapy here for LBP             [facility] Adult PT Treatment/Exercise - 04/08/21 0001       Self-Care   Self-Care Posture    Posture reviewed posture correction with noodle in lumbar area for more upright position.    Other Self-Care Comments  discussed ideal (neutral) head  position when sleeping. pt verbalized understanding.      Exercises   Exercises Neck      Neck Exercises: Machines for Strengthening   UBE (Upper Arm Bike) L1: 1 min forward, 1 min backward, standing      Neck Exercises: Seated   Cervical Rotation Right;Left;5 reps   with nods   Cervical Rotation Limitations cues for upright posture.      Neck Exercises: Supine   Neck Retraction 10 reps;3 secs    Other Supine Exercise RUE neural glides with head rotation away; 2 sets of 10 with tactile cues for form. scap retraction x 5 sec x 8 reps, L's x 3 sec x 10    Other Supine Exercise W's x 5 sec x 10 reps (in supine)      Shoulder Exercises: Stretch   Other Shoulder Stretches midlevel doorway stretch 3 positions 20 sec hold x 4      Neck Exercises: Stretches   Upper Trapezius Stretch Left;30 seconds;4 reps   cues to tuck chin.  PT Education - 04/08/21 1758     Education Details HEP - added upper trap stretch    Person(s) Educated Patient    Methods Explanation;Demonstration;Tactile cues;Verbal cues;Handout    Comprehension Verbalized understanding;Returned demonstration;Verbal cues required;Need further instruction                 PT Long Term Goals - 04/05/21 1238       PT LONG TERM GOAL #1   Title Improve sitting posture and alignment with patient to demonstrate more upright spinal alignment with sitting    Time 6    Period Weeks    Status New    Target Date 05/17/21      PT LONG TERM GOAL #2   Title Patient to report resolution of Rt UE tingling and numbness with functional activities and sleeping    Time 6    Period Weeks    Status New    Target Date 05/17/21      PT LONG TERM GOAL #3   Title Increase AROM cervical spine to WFL's throughout and pain free    Time 6    Period Weeks    Status New    Target Date 05/17/21      PT LONG TERM GOAL #4   Title Independent in HEP    Time 6    Period Weeks    Status New    Target Date  05/17/21      PT LONG TERM GOAL #5   Title Improve functional limitation score to 71    Time 6    Period Weeks    Status New    Target Date 05/17/21                   Plan - 04/08/21 1734     Clinical Impression Statement Reviewed importance of posture and head position and how it plays a role in improvment of his symptoms.  He reported reduction of radicular symtpoms with nerve glides and further reduction with supine version of HEP.  He required constant cues to bring his head into neutral position (vs Rt lateral flexion).  Goals are ongoing.    Stability/Clinical Decision Making Stable/Uncomplicated    Rehab Potential Good    PT Frequency 2x / week    PT Duration 6 weeks    PT Treatment/Interventions ADLs/Self Care Home Management;Aquatic Therapy;Cryotherapy;Electrical Stimulation;Iontophoresis 4mg /ml Dexamethasone;Moist Heat;Traction;Ultrasound;Therapeutic activities;Therapeutic exercise;Neuromuscular re-education;Patient/family education;Manual techniques;Dry needling;Taping    PT Next Visit Plan progress with neural mobilization; postural correction; manual work through cervical and thoracic areas; instruction in body mechanics - specifically sitting posture; modalities as indicated    PT Home Exercise Plan [email or web address]    Consulted and Agree with Plan of Care Patient             Patient will benefit from skilled therapeutic intervention in order to improve the following deficits and impairments:  Decreased range of motion, Impaired UE functional use, Decreased activity tolerance, Pain, Impaired flexibility, Improper body mechanics, Decreased mobility, Impaired sensation, Postural dysfunction  Visit Diagnosis: Radiculopathy, cervical region  Abnormal posture  Other symptoms and signs involving the musculoskeletal system     Problem List Patient Active Problem List   Diagnosis Date Noted   Radiculitis of right cervical region 03/29/2021   Back pain 02/21/2018    ED (erectile dysfunction) 02/21/2018   Abnormal glucose 10/11/2017   Need for hepatitis C screening test 10/10/2016   Dyslipidemia 05/13/2015   Leukopenia 05/13/2015   Family history of  premature CAD 10/02/2012   Benign essential hypertension 06/06/2011   Colon polyps 06/01/2011   Mayer Camel, PTA 04/08/21 6:01 PM   [facility] [place] [address] [place], [place], [address] Phone: [phone]   Fax:  [phone]  Name: Mathew Carter MRN: [id number] Date of Birth: [birth year]

## 2021-04-08 NOTE — Patient Instructions (Signed)
Access Code: [email or web address] URL: [email or web address] Date: 04/08/2021 Prepared by: [facility]  Exercises Seated Cervical Retraction - 3 x daily - 7 x weekly - 10 reps - 1 sets Standing Scapular Retraction - 3 x daily - 7 x weekly - 10 reps - 1 sets - 10 hold Shoulder External Rotation and Scapular Retraction - 3 x daily - 7 x weekly - 10 reps - 1 sets - hold Shoulder External Rotation in 45 Degrees Abduction - 2 x daily - 7 x weekly - 1-2 sets - 10 reps - 3 sec hold Doorway Pec Stretch at 60 Degrees Abduction - 3 x daily - 7 x weekly - 3 reps - 1 sets Doorway Pec Stretch at 90 Degrees Abduction - 3 x daily - 7 x weekly - 3 reps - 1 sets - 30 seconds hold Doorway Pec Stretch at 120 Degrees Abduction - 3 x daily - 7 x weekly - 3 reps - 1 sets - 30 second hold hold Seated Upper Trapezius Stretch - 3 x daily - 7 x weekly - 2 reps - 15-30 seconds hold

## 2021-04-12 ENCOUNTER — Encounter: Admitting: Rehabilitative and Restorative Service Providers"

## 2021-04-15 ENCOUNTER — Ambulatory Visit (INDEPENDENT_AMBULATORY_CARE_PROVIDER_SITE_OTHER): Admitting: Physical Therapy

## 2021-04-15 ENCOUNTER — Other Ambulatory Visit: Payer: Self-pay

## 2021-04-15 DIAGNOSIS — M5412 Radiculopathy, cervical region: Secondary | ICD-10-CM

## 2021-04-15 DIAGNOSIS — R29898 Other symptoms and signs involving the musculoskeletal system: Secondary | ICD-10-CM | POA: Diagnosis not present

## 2021-04-15 DIAGNOSIS — R293 Abnormal posture: Secondary | ICD-10-CM | POA: Diagnosis not present

## 2021-04-15 NOTE — Therapy (Signed)
[facility] Out[facility] [place] [address] [place] [address] [place], [place], [address] Phone: [phone]   Fax:  [phone]  Physical Therapy Treatment  Patient Details  Name: Mathew Carter MRN: [id number] Date of Birth: 21-Aug-1961 Referring Provider (PT): Dr Benjamin Stain   Encounter Date: 04/15/2021   PT End of Session - 04/15/21 1619     Visit Number 3    Number of Visits 12    Date for PT Re-Evaluation 05/17/21    PT Start Time 1619    PT Stop Time 1703    PT Time Calculation (min) 44 min    Activity Tolerance Patient tolerated treatment well    Behavior During Therapy [name] for tasks assessed/performed             Past Medical History:  Diagnosis Date   Hypertension     Past Surgical History:  Procedure Laterality Date   FOOT SURGERY     HEMORRHOID SURGERY      There were no vitals filed for this visit.   Subjective Assessment - 04/15/21 1624     Subjective Pt reports he has been using a pad for his lower back when he drives, and this has helped reduce the pain in his neck/shoulder. He states he has been more mindful of his posture and head position.    Currently in Pain? Yes    Pain Score 5     Pain Location Arm    Pain Orientation Right    Pain Descriptors / Indicators Tingling    Pain Radiating Towards right lower arm into 5ht digit    Aggravating Factors  first thing in morning, late at night    Pain Relieving Factors exercises, medicine.                [name] - 04/15/21 0001       Assessment   Medical Diagnosis Rt Cervical radiculopathy    Referring Provider (PT) Dr Benjamin Stain    Onset Date/Surgical Date 03/05/21    Hand Dominance Right    Next MD Visit 04/27/21    Prior Therapy here for LBP      AROM   Cervical - Left Side Bend 30              [facility] Adult PT Treatment/Exercise - 04/15/21 0001       Neck Exercises: Machines for Strengthening   UBE (Upper Arm Bike) L1: 1 min forward,  1 min backward, standing      Shoulder Exercises: Standing   Other Standing Exercises W's x 5 sec hold x 10 reps, back against wall, with axial ext.      Shoulder Exercises: Stretch   Other Shoulder Stretches 3 position doorway stretch x 20 sec x 3 reps with tactile cues.  bilat bicep stretch x 20 sec x 2    Other Shoulder Stretches Rt pec stretch with arm on wall, elbow straigh x 20 sec x 2 reps      Modalities   Modalities Traction      Traction   Type of Traction Cervical    Min (lbs) 10    Max (lbs) 15    Hold Time 60    Rest Time 20    Time 15   10 min, with 5 min ramp up/down time.     Neck Exercises: Stretches   Upper Trapezius Stretch Left;3 reps;20 seconds   cues to tuck chin.   Levator Stretch Left;2 reps;20 seconds  PT Long Term Goals - 04/05/21 1238       PT LONG TERM GOAL #1   Title Improve sitting posture and alignment with patient to demonstrate more upright spinal alignment with sitting    Time 6    Period Weeks    Status New    Target Date 05/17/21      PT LONG TERM GOAL #2   Title Patient to report resolution of Rt UE tingling and numbness with functional activities and sleeping    Time 6    Period Weeks    Status New    Target Date 05/17/21      PT LONG TERM GOAL #3   Title Increase AROM cervical spine to WFL's throughout and pain free    Time 6    Period Weeks    Status New    Target Date 05/17/21      PT LONG TERM GOAL #4   Title Independent in HEP    Time 6    Period Weeks    Status New    Target Date 05/17/21      PT LONG TERM GOAL #5   Title Improve functional limitation score to 71    Time 6    Period Weeks    Status New    Target Date 05/17/21                   Plan - 04/15/21 1648     Clinical Impression Statement [name].  Lt lateral flexion ROM has improved to 30 deg.  He is tolerating pec stretches well; continues to  require minor cues for form. [name]  Trial of cervical traction initiated.  Goals are ongoing.    Stability/Clinical Decision Making Stable/Uncomplicated    Rehab Potential Good    PT Frequency 2x / week    PT Duration 6 weeks    PT Treatment/Interventions ADLs/Self Care Home Management;Aquatic Therapy;Cryotherapy;Electrical Stimulation;Iontophoresis 4mg /ml Dexamethasone;Moist Heat;Traction;Ultrasound;Therapeutic activities;Therapeutic exercise;Neuromuscular re-education;Patient/family education;Manual techniques;Dry needling;Taping    PT Next Visit Plan progress with neural mobilization; postural correction; manual work through cervical and thoracic areas; instruction in body mechanics - specifically sitting posture; assess response to traction.    PT Home Exercise Plan [email or web address]    Consulted and Agree with Plan of Care Patient             Patient will benefit from skilled therapeutic intervention in order to improve the following deficits and impairments:  Decreased range of motion, Impaired UE functional use, Decreased activity tolerance, Pain, Impaired flexibility, Improper body mechanics, Decreased mobility, Impaired sensation, Postural dysfunction  Visit Diagnosis: Radiculopathy, cervical region  Abnormal posture  Other symptoms and signs involving the musculoskeletal system     Problem List Patient Active Problem List   Diagnosis Date Noted   Radiculitis of right cervical region 03/29/2021   Back pain 02/21/2018   ED (erectile dysfunction) 02/21/2018   Abnormal glucose 10/11/2017   Need for hepatitis C screening test 10/10/2016   Dyslipidemia 05/13/2015   Leukopenia 05/13/2015   Family history of premature CAD 10/02/2012   Benign essential hypertension 06/06/2011   Colon polyps 06/01/2011   08/01/2011, PTA 04/15/21 5:00 PM  [facility] [place] [address]  [place] [address] [place], [place], [place] Phone: [phone]   Fax:  [phone]  Name: Mathew Carter MRN: Willaim Sheng Date of Birth:  08/06/1961    

## 2021-04-16 ENCOUNTER — Ambulatory Visit (INDEPENDENT_AMBULATORY_CARE_PROVIDER_SITE_OTHER): Admitting: Rehabilitative and Restorative Service Providers"

## 2021-04-16 ENCOUNTER — Encounter: Payer: Self-pay | Admitting: Rehabilitative and Restorative Service Providers"

## 2021-04-16 DIAGNOSIS — R293 Abnormal posture: Secondary | ICD-10-CM | POA: Diagnosis not present

## 2021-04-16 DIAGNOSIS — R29898 Other symptoms and signs involving the musculoskeletal system: Secondary | ICD-10-CM | POA: Diagnosis not present

## 2021-04-16 DIAGNOSIS — M5412 Radiculopathy, cervical region: Secondary | ICD-10-CM | POA: Diagnosis not present

## 2021-04-16 NOTE — Therapy (Signed)
[facility] [place] [address] [place] [address] [place], [place], [address] Phone: [phone]   Fax:  [phone]  Physical Therapy Treatment  Patient Details  Name: Mathew Carter MRN: [id number] Date of Birth: 05-14-1961 Referring Provider (PT): Dr Benjamin Stain   Encounter Date: 04/16/2021   PT End of Session - 04/16/21 1529     Visit Number 4    Number of Visits 12    Date for PT Re-Evaluation 05/17/21    PT Start Time 1528    PT Stop Time 1606    PT Time Calculation (min) 38 min    Activity Tolerance Patient tolerated treatment well    Behavior During Therapy [facility] for tasks assessed/performed             Past Medical History:  Diagnosis Date   Hypertension     Past Surgical History:  Procedure Laterality Date   FOOT SURGERY     HEMORRHOID SURGERY      There were no vitals filed for this visit.   Subjective Assessment - 04/16/21 1525     Subjective Treatment yesterday helped a lot and pain is not as bad today.    Patient Stated Goals get the arm feeling right again    Currently in Pain? Yes    Pain Score --   "not as bad today"   Pain Location Arm    Pain Orientation Right    Pain Descriptors / Indicators Tingling    Pain Type Acute pain    Pain Onset More than a month ago    Pain Frequency Intermittent    Aggravating Factors  worse in morning and at night    Pain Relieving Factors exercises, medicine, traction help                [facility] PT Assessment - 04/16/21 1608       Assessment   Medical Diagnosis Rt Cervical radiculopathy    Referring Provider (PT) Dr Benjamin Stain    Onset Date/Surgical Date 03/05/21    Hand Dominance Right                           [facility] Adult PT Treatment/Exercise - 04/16/21 1608       Exercises   Exercises Neck      Neck Exercises: Standing   Wall Push Ups 10 reps    Wall Push Ups Limitations push up plus working on scapular protraction/retraction       Neck Exercises: Supine   Cervical Isometrics Extension;Right lateral flexion;Left lateral flexion;3 secs;5 reps    Neck Retraction 5 reps      Neck Exercises: Prone   Other Prone Exercise quadriped for cat/camel and then thread the needle for thoracic rotation    Other Prone Exercise Prone on elbows with neck retraction + cervical rotation; also performed prone on elbows with thoracic rotation R And L for opening;      Modalities   Modalities Moist Heat      Moist Heat Therapy   Number Minutes Moist Heat 10 Minutes    Moist Heat Location Cervical   after dry needling to reduce soreness-- no charge for hot pack     Manual Therapy   Manual Therapy Joint mobilization;Soft tissue mobilization    Manual therapy comments skilled palpation to assess response to STM and DN    Joint Mobilization supine CPA grade II-III, lateral glides mid c-spine grade II-III, downglides R side grade II,; sidelying  lateral glides grade I-II    Soft tissue mobilization STM paraspinals c-spine, upper trap, suboccipitals, levator      Neck Exercises: Stretches   Upper Trapezius Stretch Left;3 reps;20 seconds    Upper Trapezius Stretch Limitations cues on technique needed for upper trap stretch    Neck Stretch 30 seconds;3 reps   seated self mob with belt for scalenes/upper trap   Neural Stretch supine R UE neural stretch/mobilization              Trigger Point Dry Needling - 04/16/21 1613     Consent Given? Yes    Education Handout Provided Yes    Muscles Treated Head and Neck Upper trapezius;Levator scapulae    Dry Needling Comments right side    Upper Trapezius Response Twitch reponse elicited;Palpable increased muscle length    Levator Scapulae Response Twitch response elicited;Palpable increased muscle length                       PT Long Term Goals - 04/05/21 1238       PT LONG TERM GOAL #1   Title Improve sitting posture and alignment with patient to demonstrate more upright  spinal alignment with sitting    Time 6    Period Weeks    Status New    Target Date 05/17/21      PT LONG TERM GOAL #2   Title Patient to report resolution of Rt UE tingling and numbness with functional activities and sleeping    Time 6    Period Weeks    Status New    Target Date 05/17/21      PT LONG TERM GOAL #3   Title Increase AROM cervical spine to WFL's throughout and pain free    Time 6    Period Weeks    Status New    Target Date 05/17/21      PT LONG TERM GOAL #4   Title Independent in HEP    Time 6    Period Weeks    Status New    Target Date 05/17/21      PT LONG TERM GOAL #5   Title Improve functional limitation score to 71    Time 6    Period Weeks    Status New    Target Date 05/17/21                   Plan - 04/16/21 1615     Clinical Impression Statement The patient has significant muscle tightness R upper trap, levator and scalenes.  PT provided STM and dry needling to reduce muscle guarding.  Also worked on ther ex for thoracic mobility to reduce forward shoulder position which may be contributing to further overstretch of upper trap.  PT to continue working to [employer].    PT Treatment/Interventions ADLs/Self Care Home Management;Aquatic Therapy;Cryotherapy;Electrical Stimulation;Iontophoresis 4mg /ml Dexamethasone;Moist Heat;Traction;Ultrasound;Therapeutic activities;Therapeutic exercise;Neuromuscular re-education;Patient/family education;Manual techniques;Dry needling;Taping    PT Next Visit Plan How was response to DN?  STM, neural mobilization; postural correction; manual work through cervical and thoracic areas; instruction in body mechanics - specifically sitting posture; traction as needed (did not do Friday due to initiating DN)    PT Home Exercise Plan [email or web address]    Consulted and Agree with Plan of Care Patient             Patient will benefit from skilled therapeutic intervention in order to improve the following deficits and  impairments:  Visit Diagnosis: Radiculopathy, cervical region  Abnormal posture  Other symptoms and signs involving the musculoskeletal system     Problem List Patient Active Problem List   Diagnosis Date Noted   Radiculitis of right cervical region 03/29/2021   Back pain 02/21/2018   ED (erectile dysfunction) 02/21/2018   Abnormal glucose 10/11/2017   Need for hepatitis C screening test 10/10/2016   Dyslipidemia 05/13/2015   Leukopenia 05/13/2015   Family history of premature CAD 10/02/2012   Benign essential hypertension 06/06/2011   Colon polyps 06/01/2011    Miroslav Gin, PT 04/16/2021, 4:17 PM  [facility] [address] [place] [address] [place], [place], [address] Phone: [phone]   Fax:  [phone]  Name: Mathew Carter MRN: [id number] Date of Birth: Jan 23, 1961

## 2021-04-21 ENCOUNTER — Encounter: Admitting: Physical Therapy

## 2021-04-22 ENCOUNTER — Other Ambulatory Visit: Payer: Self-pay

## 2021-04-22 ENCOUNTER — Encounter: Payer: Self-pay | Admitting: Rehabilitative and Restorative Service Providers"

## 2021-04-22 ENCOUNTER — Ambulatory Visit (INDEPENDENT_AMBULATORY_CARE_PROVIDER_SITE_OTHER): Admitting: Rehabilitative and Restorative Service Providers"

## 2021-04-22 DIAGNOSIS — M5412 Radiculopathy, cervical region: Secondary | ICD-10-CM | POA: Diagnosis not present

## 2021-04-22 DIAGNOSIS — R29898 Other symptoms and signs involving the musculoskeletal system: Secondary | ICD-10-CM | POA: Diagnosis not present

## 2021-04-22 DIAGNOSIS — R293 Abnormal posture: Secondary | ICD-10-CM | POA: Diagnosis not present

## 2021-04-22 NOTE — Therapy (Signed)
[facility] [place] [address] [place], [place], [address] Phone: [phone]   Fax:  [phone]  Physical Therapy Treatment  Patient Details  Name: Mathew Carter MRN: [id number] Date of Birth: 1961-03-16 Referring Provider (PT): Dr Benjamin Stain   Encounter Date: 04/22/2021   PT End of Session - 04/22/21 1449     Visit Number 5    Number of Visits 12    Date for PT Re-Evaluation 05/17/21    PT Start Time 1447    PT Stop Time 1536    PT Time Calculation (min) 49 min    Activity Tolerance Patient tolerated treatment well             Past Medical History:  Diagnosis Date   Hypertension     Past Surgical History:  Procedure Laterality Date   FOOT SURGERY     HEMORRHOID SURGERY      There were no vitals filed for this visit.   Subjective Assessment - 04/22/21 1450     Subjective Patient reports tht he is making progress. He had a set back a couple of days ago with yard work that he did at home. He did well with the dry needling helped.    Currently in Pain? Yes    Pain Score 5     Pain Location Neck    Pain Orientation Right    Pain Descriptors / Indicators Tightness    Pain Type Acute pain                [facility] PT Assessment - 04/22/21 0001       Assessment   Medical Diagnosis Rt Cervical radiculopathy    Referring Provider (PT) Dr Benjamin Stain    Onset Date/Surgical Date 03/05/21    Hand Dominance Right    Next MD Visit 04/27/21    Prior Therapy here for LBP      Palpation   Palpation comment decreasing muscular tightness Rt ant/lat/posterior cervical musculature; upper traps; pecs                           [facility] Adult PT Treatment/Exercise - 04/22/21 0001       Neck Exercises: Machines for Strengthening   UBE (Upper Arm Bike) L4 x 3 min alternating fwd/back 1 min/1 min/ 30 sec/30 sec      Neck Exercises: Standing   Wall Push Ups 10 reps    Wall Push Ups Limitations  unable to do push up plus with proper technique      Neck Exercises: Seated   Shoulder Rolls Backwards      Neck Exercises: Supine   Neck Retraction 5 reps;5 secs    Other Supine Exercise neural glide x 15-20 reps      Neck Exercises: Stretches   Upper Trapezius Stretch Left;3 reps;20 seconds      Shoulder Exercises: Stretch   Other Shoulder Stretches 3 position doorway stretch x 20 sec x 3 reps with tactile cues.  bilat bicep stretch x 20 sec x 2      Modalities   Modalities Moist Heat      Moist Heat Therapy   Number Minutes Moist Heat 10 Minutes    Moist Heat Location Cervical   after dry needling to reduce soreness-- no charge for hot pack     Manual Therapy   Manual Therapy Joint mobilization;Soft tissue mobilization    Manual therapy comments skilled palpation to assess response to  STM and DN    Joint Mobilization supine CPA grade II-III, lateral glides mid c-spine grade II-III    Soft tissue mobilization STM ant/lat/posteriro cervical musculature, upper trap, suboccipitals, levator    Passive ROM stretching into cervical flexion; flexion with slight rotation; lateral cervical flexion    Manual Traction cervical traction 30-45 sec hold x 4 reps    Other Manual Therapy traction through long arm Rt UE with mobs through the clavicle              Trigger Point Dry Needling - 04/22/21 0001     Consent Given? Yes    Education Handout Provided Previously provided    Dry Needling Comments right side    Upper Trapezius Response Twitch reponse elicited;Palpable increased muscle length    Scalenes Response Palpable increased muscle length    Cervical multifidi Response Palpable increased muscle length                       PT Long Term Goals - 04/05/21 1238       PT LONG TERM GOAL #1   Title Improve sitting posture and alignment with patient to demonstrate more upright spinal alignment with sitting    Time 6    Period Weeks    Status New    Target Date  05/17/21      PT LONG TERM GOAL #2   Title Patient to report resolution of Rt UE tingling and numbness with functional activities and sleeping    Time 6    Period Weeks    Status New    Target Date 05/17/21      PT LONG TERM GOAL #3   Title Increase AROM cervical spine to WFL's throughout and pain free    Time 6    Period Weeks    Status New    Target Date 05/17/21      PT LONG TERM GOAL #4   Title Independent in HEP    Time 6    Period Weeks    Status New    Target Date 05/17/21      PT LONG TERM GOAL #5   Title Improve functional limitation score to 71    Time 6    Period Weeks    Status New    Target Date 05/17/21                   Plan - 04/22/21 1532     Clinical Impression Statement Good response to DN and manual work. Patient has decreasing tightness through the Rt cervical and shoulder girdle area. Remains tight through the Rt scaleni; upper trap; leveator. Patient has difficulty demonstrating exercises - he has poor technique with exercises and difficulty correcting. Will require continued instruction in HEP.    Rehab Potential Good    PT Frequency 2x / week    PT Duration 6 weeks    PT Treatment/Interventions ADLs/Self Care Home Management;Aquatic Therapy;Cryotherapy;Electrical Stimulation;Iontophoresis 4mg /ml Dexamethasone;Moist Heat;Traction;Ultrasound;Therapeutic activities;Therapeutic exercise;Neuromuscular re-education;Patient/family education;Manual techniques;Dry needling;Taping    PT Next Visit Plan Continue with DN and STM as indicated, neural mobilization; postural correction; manual work through cervical and thoracic areas; instruction in body mechanics - sitting posture    PT Home Exercise Plan [email or web address]    Consulted and Agree with Plan of Care Patient             Patient will benefit from skilled therapeutic intervention in order to improve the following deficits and impairments:  Visit Diagnosis: Radiculopathy, cervical  region  Abnormal posture  Other symptoms and signs involving the musculoskeletal system     Problem List Patient Active Problem List   Diagnosis Date Noted   Radiculitis of right cervical region 03/29/2021   Back pain 02/21/2018   ED (erectile dysfunction) 02/21/2018   Abnormal glucose 10/11/2017   Need for hepatitis C screening test 10/10/2016   Dyslipidemia 05/13/2015   Leukopenia 05/13/2015   Family history of premature CAD 10/02/2012   Benign essential hypertension 06/06/2011   Colon polyps 06/01/2011    Jerilynn Feldmeier Rober Minion PT, MPH  04/22/2021, 3:42 PM  [facility] [address] [place], [place], [address] Phone: [phone]   Fax:  [phone]  Name: Mathew Carter MRN: [id number] Date of Birth: [birth year]

## 2021-04-23 ENCOUNTER — Ambulatory Visit (INDEPENDENT_AMBULATORY_CARE_PROVIDER_SITE_OTHER): Admitting: Physical Therapy

## 2021-04-23 DIAGNOSIS — R29898 Other symptoms and signs involving the musculoskeletal system: Secondary | ICD-10-CM | POA: Diagnosis not present

## 2021-04-23 DIAGNOSIS — M5412 Radiculopathy, cervical region: Secondary | ICD-10-CM

## 2021-04-23 DIAGNOSIS — R293 Abnormal posture: Secondary | ICD-10-CM

## 2021-04-23 NOTE — Therapy (Signed)
[facility] [place] [address] [place] [address] [place], [place], [address] Phone: [phone]   Fax:  [phone]  Physical Therapy Treatment  Patient Details  Name: Mathew Carter MRN: [id number] Date of Birth: 09/19/1961 Referring Provider (PT): Dr Benjamin Stain   Encounter Date: 04/23/2021   PT End of Session - 04/23/21 1438     Visit Number 6    Number of Visits 12    Date for PT Re-Evaluation 05/17/21    PT Start Time 1400    PT Stop Time 1445    PT Time Calculation (min) 45 min    Activity Tolerance Patient tolerated treatment well    Behavior During Therapy [facility] for tasks assessed/performed             Past Medical History:  Diagnosis Date   Hypertension     Past Surgical History:  Procedure Laterality Date   FOOT SURGERY     HEMORRHOID SURGERY      There were no vitals filed for this visit.   Subjective Assessment - 04/23/21 1402     Subjective Pt states that the needling helped him feel better. He states that he has less radicular symptoms    Patient Stated Goals get the arm feeling right again    Currently in Pain? Yes    Pain Score 4     Pain Location Neck    Pain Orientation Right    Pain Descriptors / Indicators Tightness    Pain Type Acute pain                [facility] PT Assessment - 04/23/21 0001       Assessment   Medical Diagnosis Rt Cervical radiculopathy    Referring Provider (PT) Dr Benjamin Stain    Onset Date/Surgical Date 03/05/21    Hand Dominance Right    Next MD Visit 04/27/21      AROM   Cervical Flexion 45    Cervical Extension 45    Cervical - Right Side Bend 28    Cervical - Left Side Bend 30    Cervical - Right Rotation 65    Cervical - Left Rotation 55                           [facility] Adult PT Treatment/Exercise - 04/23/21 0001       Neck Exercises: Machines for Strengthening   UBE (Upper Arm Bike) L4 x 3 min alternating fwd/back 1 min/1 min/ 30 sec/30 sec       Neck Exercises: Standing   Wall Push Ups 10 reps    Wall Push Ups Limitations push up plus      Neck Exercises: Supine   Neck Retraction 5 reps;5 secs      Neck Exercises: Stretches   Upper Trapezius Stretch Right;Left;3 reps;20 seconds      Shoulder Exercises: Stretch   Other Shoulder Stretches 3 position doorway stretch 3 x 20 sec      Traction   Type of Traction Cervical    Min (lbs) 10    Max (lbs) 18    Hold Time 60    Rest Time 20    Time 10 min      Manual Therapy   Joint Mobilization supine CPA grade II-III, lateral glides mid c-spine grade II-III    Soft tissue mobilization STM cervical paraspinals, upper traps, levator    Passive ROM lateral flexion and rotation    Manual  Traction cervical traction 30-45 sec hold x 4 reps                         PT Long Term Goals - 04/05/21 1238       PT LONG TERM GOAL #1   Title Improve sitting posture and alignment with patient to demonstrate more upright spinal alignment with sitting    Time 6    Period Weeks    Status New    Target Date 05/17/21      PT LONG TERM GOAL #2   Title Patient to report resolution of Rt UE tingling and numbness with functional activities and sleeping    Time 6    Period Weeks    Status New    Target Date 05/17/21      PT LONG TERM GOAL #3   Title Increase AROM cervical spine to WFL's throughout and pain free    Time 6    Period Weeks    Status New    Target Date 05/17/21      PT LONG TERM GOAL #4   Title Independent in HEP    Time 6    Period Weeks    Status New    Target Date 05/17/21      PT LONG TERM GOAL #5   Title Improve functional limitation score to 71    Time 6    Period Weeks    Status New    Target Date 05/17/21                   Plan - 04/23/21 1438     Clinical Impression Statement Pt with good tolerance to manual work and improved ROM noted this session in rotation and lateral flexion. Pt still with signficant mm spasticity in  bilat upper traps and levator. Pt requires cues for posture during all therex    PT Next Visit Plan postural strengthening and correction, manual and modalities as needed    PT Home Exercise Plan [email or web address]    Consulted and Agree with Plan of Care Patient             Patient will benefit from skilled therapeutic intervention in order to improve the following deficits and impairments:     Visit Diagnosis: Radiculopathy, cervical region  Abnormal posture  Other symptoms and signs involving the musculoskeletal system     Problem List Patient Active Problem List   Diagnosis Date Noted   Radiculitis of right cervical region 03/29/2021   Back pain 02/21/2018   ED (erectile dysfunction) 02/21/2018   Abnormal glucose 10/11/2017   Need for hepatitis C screening test 10/10/2016   Dyslipidemia 05/13/2015   Leukopenia 05/13/2015   Family history of premature CAD 10/02/2012   Benign essential hypertension 06/06/2011   Colon polyps 06/01/2011   Reggy Eye, PT  [name] 04/23/2021, 2:40 PM  [facility] [address] [place], [place], [address] Phone: [phone]   Fax:  [phone]  Name: Mathew Carter MRN: [id number] Date of Birth: 1961-09-10

## 2021-04-27 ENCOUNTER — Ambulatory Visit: Admitting: Sports Medicine

## 2021-04-28 ENCOUNTER — Other Ambulatory Visit: Payer: Self-pay

## 2021-04-28 ENCOUNTER — Ambulatory Visit (INDEPENDENT_AMBULATORY_CARE_PROVIDER_SITE_OTHER): Admitting: Physical Therapy

## 2021-04-28 DIAGNOSIS — R29898 Other symptoms and signs involving the musculoskeletal system: Secondary | ICD-10-CM | POA: Diagnosis not present

## 2021-04-28 DIAGNOSIS — M5412 Radiculopathy, cervical region: Secondary | ICD-10-CM | POA: Diagnosis not present

## 2021-04-28 DIAGNOSIS — R293 Abnormal posture: Secondary | ICD-10-CM

## 2021-04-28 NOTE — Therapy (Signed)
[place], [place], [address] Phone: [phone]   Fax:  [phone]  Physical Therapy Treatment  Patient Details  Name: Mathew Carter MRN: [id number] Date of Birth: [birth year] Referring Provider (PT): Dr Dianah Field   Encounter Date: 04/28/2021   PT End of Session - 04/28/21 1611     Visit Number 7    Number of Visits 12    Date for PT Re-Evaluation 05/17/21    PT Start Time 1540    PT Stop Time 0300   pt had to leave early for another appt   PT Time Calculation (min) 29 min    Activity Tolerance Patient tolerated treatment well    Behavior During Therapy [facility] for tasks assessed/performed             Past Medical History:  Diagnosis Date   Hypertension     Past Surgical History:  Procedure Laterality Date   FOOT SURGERY     HEMORRHOID SURGERY      There were no vitals filed for this visit.   Subjective Assessment - 04/28/21 1541     Subjective Pt states he feels "some better".  He reports he is more aware of posture and has had a lot of relief with DN and traction.    Patient Stated Goals get the arm feeling right again    Currently in Pain? Yes    Pain Score 3     Pain Location Arm    Pain Orientation Right    Pain Descriptors / Indicators Simonne Martinet PT Assessment - 04/28/21 0001       Assessment   Medical Diagnosis Rt Cervical radiculopathy    Referring Provider (PT) Dr Dianah Field    Onset Date/Surgical Date 03/05/21    Hand Dominance Right              [facility] Adult PT Treatment/Exercise - 04/28/21 0001       Neck Exercises: Machines for Strengthening   UBE (Upper Arm Bike) L4 x 3 min alt fwd/bkwd      Neck Exercises: Seated   Other Seated Exercise scap retraction with red band x 10 reps; row with green band x 10      Neck Exercises: Supine   Neck Retraction 5 reps;5 secs      Neck Exercises: Stretches   Neck Stretch 30  seconds;3 reps   seated self mob with belt for scalenes/upper trap   Neural Stretch supine R UE neural stretch/mobilization      Shoulder Exercises: Stretch   Other Shoulder Stretches 3 position doorway stretch 3 x 20 sec      Manual Therapy   Soft tissue mobilization STM cervical paraspinals, upper traps, levator    Passive ROM lateral flexion and rotation    Manual Traction cervical traction 30-45 sec hold x 4 reps                PT Long Term Goals - 04/28/21 1614       PT LONG TERM GOAL #1   Title Improve sitting posture and alignment with patient to demonstrate more upright spinal alignment with sitting    Time 6    Period Weeks    Status Partially Met      PT LONG TERM GOAL #2   Title Patient to report resolution of Rt UE tingling and numbness with functional activities and  sleeping    Time 6    Period Weeks    Status Partially Met      PT LONG TERM GOAL #3   Title Increase AROM cervical spine to WFL's throughout and pain free    Time 6    Period Weeks    Status Partially Met      PT LONG TERM GOAL #4   Title Independent in HEP    Time 6    Period Weeks    Status On-going      PT LONG TERM GOAL #5   Title Improve functional limitation score to 71    Time 6    Period Weeks    Status On-going                   Plan - 04/28/21 1609     Clinical Impression Statement Pt is requiring less cues for neutral head position during sessions; posture is improving.  Continued muscle spasticity in bilat upper traps, scalenes, levator; improved with manual therapy.  Pt reporting less radicular symptoms; now only reporting feeling of swelling in hand/forearm, not tingling. Pt is making good progress towards therapy goals.    PT Next Visit Plan postural strengthening and correction, manual and modalities as needed    PT Home Exercise Plan [email or web address]    Consulted and Agree with Plan of Care Patient             Patient will benefit from skilled therapeutic  intervention in order to improve the following deficits and impairments:  Decreased range of motion, Impaired UE functional use, Decreased activity tolerance, Pain, Impaired flexibility, Improper body mechanics, Decreased mobility, Impaired sensation, Postural dysfunction  Visit Diagnosis: Radiculopathy, cervical region  Abnormal posture  Other symptoms and signs involving the musculoskeletal system     Problem List Patient Active Problem List   Diagnosis Date Noted   Radiculitis of right cervical region 03/29/2021   Back pain 02/21/2018   ED (erectile dysfunction) 02/21/2018   Abnormal glucose 10/11/2017   Need for hepatitis C screening test 10/10/2016   Dyslipidemia 05/13/2015   Leukopenia 05/13/2015   Family history of premature CAD 10/02/2012   Benign essential hypertension 06/06/2011   Colon polyps 06/01/2011   Kerin Perna, PTA 04/28/21 4:14 PM   [place] [facility] [place], [place], [address] Phone: [phone]   Fax:  [phone]  Name: Mathew Carter MRN: [id number] Date of Birth: 1960-12-22

## 2021-04-30 ENCOUNTER — Ambulatory Visit (INDEPENDENT_AMBULATORY_CARE_PROVIDER_SITE_OTHER): Admitting: Rehabilitative and Restorative Service Providers"

## 2021-04-30 ENCOUNTER — Other Ambulatory Visit: Payer: Self-pay

## 2021-04-30 DIAGNOSIS — M5412 Radiculopathy, cervical region: Secondary | ICD-10-CM

## 2021-04-30 DIAGNOSIS — R293 Abnormal posture: Secondary | ICD-10-CM

## 2021-04-30 DIAGNOSIS — R29898 Other symptoms and signs involving the musculoskeletal system: Secondary | ICD-10-CM

## 2021-04-30 NOTE — Patient Instructions (Signed)
Access Code: [email or web address] URL: [email or web address] Date: 04/30/2021 Prepared by: Margretta Ditty  Program Notes AT the gym:  limit rowing machine to 10-15 minutes.    Exercises Seated Cervical Retraction - 3 x daily - 7 x weekly - 10 reps - 1 sets Shoulder External Rotation and Scapular Retraction with Resistance - 2 x daily - 7 x weekly - 1 sets - 10 reps Standing Bilateral Low Shoulder Row with Anchored Resistance - 2 x daily - 7 x weekly - 1 sets - 10 reps Doorway Pec Stretch at 60 Degrees Abduction - 3 x daily - 7 x weekly - 3 reps - 1 sets Doorway Pec Stretch at 90 Degrees Abduction - 3 x daily - 7 x weekly - 3 reps - 1 sets - 30 seconds hold Doorway Pec Stretch at 120 Degrees Abduction - 3 x daily - 7 x weekly - 3 reps - 1 sets - 30 second hold hold Seated Upper Trapezius Stretch - 3 x daily - 7 x weekly - 2 reps - 15-30 seconds hold Prone Shoulder Flexion - 2 x daily - 7 x weekly - 1 sets - 10 reps Prone Shoulder Horizontal Abduction with Thumbs Up - 2 x daily - 7 x weekly - 1 sets - 10 reps

## 2021-04-30 NOTE — Therapy (Signed)
[place], [place], [address] Phone: [phone]   Fax:  [phone]  Physical Therapy Treatment  Patient Details  Name: Mathew Carter MRN: [id number] Date of Birth: May 16, 1961 Referring Provider (PT): Dr Dianah Field   Encounter Date: 04/30/2021   PT End of Session - 04/30/21 1539     Visit Number 8    Number of Visits 12    Date for PT Re-Evaluation 05/17/21    Authorization Type tricare/[employer]are [employer]    PT Start Time 9702    PT Stop Time 1615    PT Time Calculation (min) 40 min    Activity Tolerance Patient tolerated treatment well    Behavior During Therapy [facility] for tasks assessed/performed             Past Medical History:  Diagnosis Date   Hypertension     Past Surgical History:  Procedure Laterality Date   FOOT SURGERY     HEMORRHOID SURGERY      There were no vitals filed for this visit.   Subjective Assessment - 04/30/21 1534     Subjective The patient states he feels "swollen" in his R forearm today, but no numbness and tingling.  PT palpated arm -- patient with some soreness over R medial forearm.    Patient Stated Goals get the arm feeling right again    Currently in Pain? Yes    Pain Score 4    but states "much better, almost no pain"   Pain Location Neck    Pain Orientation Right    Pain Type Chronic pain    Pain Onset More than a month ago    Pain Frequency Intermittent    Aggravating Factors  unsure    Pain Relieving Factors exercises, traction                [facility] PT Assessment - 04/30/21 1540       Assessment   Medical Diagnosis Rt Cervical radiculopathy    Referring Provider (PT) Dr Dianah Field    Onset Date/Surgical Date 03/05/21    Hand Dominance Right                           [facility] Adult PT Treatment/Exercise - 04/30/21 1540       Exercises   Exercises Neck;Other Exercises;Shoulder    Other Exercises  tricep  kickbacks 5 lbs R and L x 12 reps,      Neck Exercises: Machines for Strengthening   UBE (Upper Arm Bike) L4 x 3 min alt fwd/bkwd      Neck Exercises: Seated   Other Seated Exercise thoracic extension over chair x 3 reps      Neck Exercises: Stretches   Other Neck Stretches door frame stretch arms in V      Shoulder Exercises: Prone   Retraction Strengthening;Both;12 reps    Retraction Limitations Ts with thumbs to ceiling    Flexion Strengthening;Both;12 reps    Flexion Limitations lifting yoga block x 12 reps    Other Prone Exercises prone on elbows with thoracic rotation      Shoulder Exercises: Standing   External Rotation Strengthening;Both;12 reps    Theraband Level (Shoulder External Rotation) Level 3 (Green)    External Rotation Limitations Ls with cues on scap retraction    ABduction Strengthening;Both;15 reps    Shoulder ABduction Weight (lbs) 5    ABduction Limitations scaption    Row  Strengthening;Both;15 reps    Theraband Level (Shoulder Row) Level 4 (Blue)    Row Limitations needs cues to avoid shoulder elevation    Other Standing Exercises bow and arrow Blue band x 12 reps R and L      Manual Therapy   Manual Therapy Soft tissue mobilization;Manual Traction    Soft tissue mobilization STM bilat upper trap, suboccipitals    Manual Traction manual cervical traction, intermittent x 2 minutes                    PT Education - 04/30/21 1626     Education Details HEP --progressed to add more strengthening    Person(s) Educated Patient    Methods Explanation;Demonstration;Handout    Comprehension Verbalized understanding;Returned demonstration                 PT Long Term Goals - 04/30/21 1700       PT LONG TERM GOAL #1   Title Improve sitting posture and alignment with patient to demonstrate more upright spinal alignment with sitting    Time 6    Period Weeks    Status Partially Met    Target Date 05/17/21      PT LONG TERM GOAL #2    Title Patient to report resolution of Rt UE tingling and numbness with functional activities and sleeping    Time 6    Period Weeks    Status Partially Met      PT LONG TERM GOAL #3   Title Increase AROM cervical spine to WFL's throughout and pain free    Time 6    Period Weeks    Status Partially Met      PT LONG TERM GOAL #4   Title Independent in HEP    Time 6    Period Weeks    Status On-going      PT LONG TERM GOAL #5   Title Improve functional limitation score to 71    Time 6    Period Weeks    Status On-going                   Plan - 04/30/21 1540     Clinical Impression Statement The patient tolerated strengthening progresison in the clinic.  He does need demonstration and tactile cues to perform exercises correctly. PT provided corrections for HEP additions.  We also discussed rowing at the gym and limiting time as I anticipate he uses shoulder shrug when fatigued.  PT to continue progressing to LTGs.    PT Treatment/Interventions ADLs/Self Care Home Management;Aquatic Therapy;Cryotherapy;Electrical Stimulation;Iontophoresis 22m/ml Dexamethasone;Moist Heat;Traction;Ultrasound;Therapeutic activities;Therapeutic exercise;Neuromuscular re-education;Patient/family education;Manual techniques;Dry needling;Taping    PT Next Visit Plan postural strengthening and correction, manual and modalities as needed; begin to check LTGs and d/c planning as able    PT Home Exercise Plan [email or web address]    Consulted and Agree with Plan of Care Patient             Patient will benefit from skilled therapeutic intervention in order to improve the following deficits and impairments:  Decreased range of motion, Impaired UE functional use, Decreased activity tolerance, Pain, Impaired flexibility, Improper body mechanics, Decreased mobility, Impaired sensation, Postural dysfunction  Visit Diagnosis: Radiculopathy, cervical region  Abnormal posture  Other symptoms and signs involving  the musculoskeletal system     Problem List Patient Active Problem List   Diagnosis Date Noted   Radiculitis of right cervical region 03/29/2021   Back pain 02/21/2018  ED (erectile dysfunction) 02/21/2018   Abnormal glucose 10/11/2017   Need for hepatitis C screening test 10/10/2016   Dyslipidemia 05/13/2015   Leukopenia 05/13/2015   Family history of premature CAD 10/02/2012   Benign essential hypertension 06/06/2011   Colon polyps 06/01/2011   Rudell Cobb, MPT  Kamir Selover 04/30/2021, 5:04 PM  [facility] [place], [place], [address] Phone: [phone]   Fax:  [phone]  Name: Mathew Carter MRN: [id number] Date of Birth: 1961/02/25

## 2021-05-05 ENCOUNTER — Encounter: Payer: Self-pay | Admitting: Physical Therapy

## 2021-05-05 ENCOUNTER — Ambulatory Visit (INDEPENDENT_AMBULATORY_CARE_PROVIDER_SITE_OTHER): Admitting: Physical Therapy

## 2021-05-05 ENCOUNTER — Other Ambulatory Visit: Payer: Self-pay

## 2021-05-05 DIAGNOSIS — R293 Abnormal posture: Secondary | ICD-10-CM

## 2021-05-05 DIAGNOSIS — R29898 Other symptoms and signs involving the musculoskeletal system: Secondary | ICD-10-CM | POA: Diagnosis not present

## 2021-05-05 DIAGNOSIS — M5412 Radiculopathy, cervical region: Secondary | ICD-10-CM | POA: Diagnosis not present

## 2021-05-05 NOTE — Therapy (Signed)
[place], [place], [address] Phone: [phone]   Fax:  [phone]  Physical Therapy Treatment  Patient Details  Name: Mathew Carter MRN: [id number] Date of Birth: [birth year] Referring Provider (PT): Dr Dianah Field   Encounter Date: 05/05/2021   PT End of Session - 05/05/21 1250     Visit Number 9    Number of Visits 12    Date for PT Re-Evaluation 05/17/21    Authorization Type tricare/tricare east    PT Start Time 1157   pt arrived late   PT Stop Time 1228    PT Time Calculation (min) 31 min    Activity Tolerance Patient tolerated treatment well    Behavior During Therapy [facility] for tasks assessed/performed             Past Medical History:  Diagnosis Date   Hypertension     Past Surgical History:  Procedure Laterality Date   FOOT SURGERY     HEMORRHOID SURGERY      There were no vitals filed for this visit.   Subjective Assessment - 05/05/21 1200     Subjective Pt reports he hasn't completed his exercises in the last week since he has been moving his daughter into college.  He reports "it's a whole lot better" than when he started therapy. No pain or tingling, just numbness in Rt hand.    Currently in Pain? No/denies    Pain Score 3     Pain Location Hand    Pain Orientation Right;Medial   4th and 5th digit   Pain Descriptors / Indicators Numbness    Aggravating Factors  unsure    Pain Relieving Factors relaxing                [facility] PT Assessment - 05/05/21 0001       Assessment   Medical Diagnosis Rt Cervical radiculopathy    Referring Provider (PT) Dr Dianah Field    Onset Date/Surgical Date 03/05/21    Hand Dominance Right    Next MD Visit 05/07/21      Observation/Other Assessments   Focus on Therapeutic Outcomes (FOTO)  63 functional score, goal of 71      AROM   Cervical - Right Side Bend 42    Cervical - Left Side Bend 28    Cervical - Right  Rotation 60    Cervical - Left Rotation 70               [facility] Adult PT Treatment/Exercise - 05/05/21 0001       Neck Exercises: Stretches   Upper Trapezius Stretch Left;2 reps;20 seconds    Levator Stretch Left;2 reps      Shoulder Exercises: Supine   External Rotation Both;15 reps    Theraband Level (Shoulder External Rotation) Level 3 (Green)    Diagonals Strengthening;Right;Left;10 reps, green band   Other Supine Exercises RUE neural glides x 10      Shoulder Exercises: Prone   Flexion Strengthening;Right;Left;10 reps   head off edge of table, opp hand on floor.   Horizontal ABduction 1 Strengthening;Both;10 reps   palms down     Shoulder Exercises: ROM/Strengthening   UBE (Upper Arm Bike) L2: 2 min forward, 1 min backward.      Shoulder Exercises: Stretch   Other Shoulder Stretches 3 position doorway stretch 2 x 20 sec, bilat bicep stretch x 2 reps of 20 sec.  PT Long Term Goals - 05/05/21 1234       PT LONG TERM GOAL #1   Title Improve sitting posture and alignment with patient to demonstrate more upright spinal alignment with sitting    Baseline requires freq cues to bring head to neutral from laterally flexed to Rt.    Time 6    Period Weeks    Status Partially Met      PT LONG TERM GOAL #2   Title Patient to report resolution of Rt UE tingling and numbness with functional activities and sleeping    Baseline reduced radicular symptoms, but still present.    Time 6    Period Weeks    Status Partially Met      PT LONG TERM GOAL #3   Title Increase AROM cervical spine to WFL's throughout and pain free    Baseline limited Lt lateral flexion, but painfree.    Time 6    Period Weeks    Status Partially Met      PT LONG TERM GOAL #4   Title Independent in HEP    Baseline pt verbalized inconsistance with HEP; continues to require cues for form of exercises.    Time 6    Period Weeks    Status Partially Met      PT LONG TERM GOAL #5    Title Improve functional limitation score to 71    Baseline 55 at intake, 63 on 05/05/21    Time 6    Period Weeks    Status On-going                   Plan - 05/05/21 1244     Clinical Impression Statement Pt's reports that radicular symptoms in Rt hand are improved with exercises and rest, but are still present. Continued need for postural correction and cues for correct form with exercises.  He tolerated all exercises without increase in symptoms.  Pt given encouragement for consistent completion of HEP and more attention to improved posture to assist with elimination of RUE symptoms.  Pt has partially met his goals.    PT Frequency 2x / week    PT Duration 6 weeks    PT Treatment/Interventions ADLs/Self Care Home Management;Aquatic Therapy;Cryotherapy;Electrical Stimulation;Iontophoresis 73m/ml Dexamethasone;Moist Heat;Traction;Ultrasound;Therapeutic activities;Therapeutic exercise;Neuromuscular re-education;Patient/family education;Manual techniques;Dry needling;Taping    PT Next Visit Plan postural strengthening and correction, manual and modalities as needed; d/c planning as able    PT Home Exercise Plan [id number]    Consulted and Agree with Plan of Care Patient             Patient will benefit from skilled therapeutic intervention in order to improve the following deficits and impairments:  Decreased range of motion, Impaired UE functional use, Decreased activity tolerance, Pain, Impaired flexibility, Improper body mechanics, Decreased mobility, Impaired sensation, Postural dysfunction  Visit Diagnosis: Radiculopathy, cervical region  Abnormal posture  Other symptoms and signs involving the musculoskeletal system     Problem List Patient Active Problem List   Diagnosis Date Noted   Radiculitis of right cervical region 03/29/2021   Back pain 02/21/2018   ED (erectile dysfunction) 02/21/2018   Abnormal glucose 10/11/2017   Need for hepatitis C screening test  10/10/2016   Dyslipidemia 05/13/2015   Leukopenia 05/13/2015   Family history of premature CAD 10/02/2012   Benign essential hypertension 06/06/2011   Colon polyps 06/01/2011    JKerin Perna PTA 05/05/21 12:53 PM   [id number]  [place], [place], [address] Phone: [phone]   Fax:  [phone]  Name: Tracer Gutridge MRN: [id number] Date of Birth: 1961/03/13

## 2021-05-07 ENCOUNTER — Ambulatory Visit (INDEPENDENT_AMBULATORY_CARE_PROVIDER_SITE_OTHER): Admitting: Sports Medicine

## 2021-05-07 ENCOUNTER — Other Ambulatory Visit: Payer: Self-pay

## 2021-05-07 DIAGNOSIS — M5412 Radiculopathy, cervical region: Secondary | ICD-10-CM | POA: Diagnosis not present
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
## 2021-05-13 ENCOUNTER — Ambulatory Visit (INDEPENDENT_AMBULATORY_CARE_PROVIDER_SITE_OTHER): Admitting: Rehabilitative and Restorative Service Providers"

## 2021-05-13 ENCOUNTER — Other Ambulatory Visit: Payer: Self-pay

## 2021-05-13 DIAGNOSIS — M5412 Radiculopathy, cervical region: Secondary | ICD-10-CM | POA: Diagnosis not present

## 2021-05-13 DIAGNOSIS — R293 Abnormal posture: Secondary | ICD-10-CM | POA: Diagnosis not present

## 2021-05-13 DIAGNOSIS — R29898 Other symptoms and signs involving the musculoskeletal system: Secondary | ICD-10-CM | POA: Diagnosis not present

## 2021-05-13 NOTE — Therapy (Signed)
[place], [place], [address] Phone: [phone]   Fax:  [phone]  Physical Therapy Treatment and Renewal summary  Patient Details  Name: Mathew Carter MRN: [id number] Date of Birth: 1961/01/23 Referring Provider (PT): Dr Dianah Field   Encounter Date: 05/13/2021   PT End of Session - 05/13/21 1826     Visit Number 10    Number of Visits 14    Date for PT Re-Evaluation 06/12/21    Authorization Type tricare/tricare east    PT Start Time 1632    PT Stop Time 1700    PT Time Calculation (min) 28 min    Activity Tolerance Patient tolerated treatment well    Behavior During Therapy [facility] for tasks assessed/performed             Past Medical History:  Diagnosis Date   Hypertension     Past Surgical History:  Procedure Laterality Date   FOOT SURGERY     HEMORRHOID SURGERY      There were no vitals filed for this visit.   Subjective Assessment - 05/13/21 1631     Subjective No tingling today; he reports "some swelling".   MD wants him to continue PT 1x/week.                [facility] PT Assessment - 05/13/21 1642       Assessment   Medical Diagnosis Rt Cervical radiculopathy    Referring Provider (PT) Dr Dianah Field    Onset Date/Surgical Date 03/05/21    Hand Dominance Right      AROM   Cervical - Right Side Bend 35    Cervical - Left Side Bend 28                           [facility] Adult PT Treatment/Exercise - 05/13/21 1642       Exercises   Exercises Neck;Other Exercises;Shoulder      Neck Exercises: Supine   Other Supine Exercise reviewed gym routine-- don't recommend sit ups right now due to technique with anterior neck increasing workload; instead recommended plank to side plank transitions      Neck Exercises: Sidelying   Lateral Flexion Right;Left;5 reps      Neck Exercises: Prone   Plank on elbows with alternating hip flexion      Neck  Exercises: Stretches   Upper Trapezius Stretch Left;1 rep;30 seconds      Shoulder Exercises: Standing   ABduction Strengthening;Both;15 reps    Shoulder ABduction Weight (lbs) 5    ABduction Limitations scaption    Other Standing Exercises bow and arrow green band x 12 reps R and L    Other Standing Exercises overhead press x 10 reps      Manual Therapy   Manual Therapy Soft tissue mobilization    Soft tissue mobilization STM bilat scalenes, suboccipitals    Manual Traction manual cervical traction, intermittent x 2 minutes    Other Manual Therapy sidelying reach to the floor lengthening while performing isometric chin tuck                    PT Education - 05/13/21 1826     Education Details discussed gym routine-- recommended less sit ups due to anterior neck engagement and posture    Person(s) Educated Patient    Methods Explanation;Demonstration;Handout    Comprehension Verbalized understanding;Returned demonstration  PT Long Term Goals - 05/13/21 1640       PT LONG TERM GOAL #1   Title Improve sitting posture and alignment with patient to demonstrate more upright spinal alignment with sitting    Baseline requires freq cues to bring head to neutral from laterally flexed to Rt.    Time 4    Period Weeks    Status Revised    Target Date 06/12/21      PT LONG TERM GOAL #2   Title Patient to report resolution of Rt UE tingling and numbness with functional activities and sleeping    Baseline reduced radicular symptoms, but still present.    Time 4    Period Weeks    Status Revised    Target Date 06/12/21      PT LONG TERM GOAL #3   Title Increase AROM cervical spine to WFL's throughout and pain free    Baseline limited Lt lateral flexion, but painfree.    Time 4    Period Weeks    Status Revised    Target Date 06/12/21      PT LONG TERM GOAL #4   Title Independent in HEP    Baseline pt verbalized inconsistance with HEP; continues  to require cues for form of exercises.    Time 4    Period Weeks    Status Revised    Target Date 06/12/21      PT LONG TERM GOAL #5   Title Improve functional limitation score to 71    Baseline 55 at intake, 63 on 05/05/21    Time 4    Period Weeks    Status Revised    Target Date 06/12/21                   Plan - 05/13/21 1828     Clinical Impression Statement The patient has partially met LTGs.  PT updating current goals with revised date and dec'ing frequency to 1x/week based on patient's recent MD appt and the patient's request.  We discussed focus for continued strengthening and return to prior gym routine.    PT Frequency 1x / week    PT Duration 4 weeks    PT Treatment/Interventions ADLs/Self Care Home Management;Aquatic Therapy;Cryotherapy;Electrical Stimulation;Iontophoresis 18m/ml Dexamethasone;Moist Heat;Traction;Ultrasound;Therapeutic activities;Therapeutic exercise;Neuromuscular re-education;Patient/family education;Manual techniques;Dry needling;Taping             Patient will benefit from skilled therapeutic intervention in order to improve the following deficits and impairments:  Decreased range of motion, Impaired UE functional use, Decreased activity tolerance, Pain, Impaired flexibility, Improper body mechanics, Decreased mobility, Impaired sensation, Postural dysfunction  Visit Diagnosis: Radiculopathy, cervical region  Abnormal posture  Other symptoms and signs involving the musculoskeletal system     Problem List Patient Active Problem List   Diagnosis Date Noted   Radiculitis of right cervical region 03/29/2021   Back pain 02/21/2018   ED (erectile dysfunction) 02/21/2018   Abnormal glucose 10/11/2017   Need for hepatitis C screening test 10/10/2016   Dyslipidemia 05/13/2015   Leukopenia 05/13/2015   Family history of premature CAD 10/02/2012   Benign essential hypertension 06/06/2011   Colon polyps 06/01/2011   CRudell Cobb  MPT  Briana Farner 05/13/2021, 6:29 PM  [address] [address]Phone: [phone]  Fax:  [phone] Name: Mathew MundtMRN: [id number] of Birth: [birth year]

## 2021-05-19 ENCOUNTER — Ambulatory Visit (INDEPENDENT_AMBULATORY_CARE_PROVIDER_SITE_OTHER): Admitting: Physical Therapy

## 2021-05-19 ENCOUNTER — Encounter: Payer: Self-pay | Admitting: Physical Therapy

## 2021-05-19 ENCOUNTER — Other Ambulatory Visit: Payer: Self-pay

## 2021-05-19 DIAGNOSIS — R29898 Other symptoms and signs involving the musculoskeletal system: Secondary | ICD-10-CM

## 2021-05-19 DIAGNOSIS — M5412 Radiculopathy, cervical region: Secondary | ICD-10-CM | POA: Diagnosis not present

## 2021-05-19 DIAGNOSIS — R293 Abnormal posture: Secondary | ICD-10-CM | POA: Diagnosis not present

## 2021-05-19 NOTE — Therapy (Signed)
[facility] [place] [address] [place], [place], [address] Phone: [phone]   Fax:  [phone]  Physical Therapy Treatment  Patient Details  Name: Mathew Carter MRN: [id number] Date of Birth: 05/27/1961 Referring Provider (PT): Dr Benjamin Stain   Encounter Date: 05/19/2021   PT End of Session - 05/19/21 1438     Visit Number 11    Number of Visits 14    Date for PT Re-Evaluation 06/12/21    Authorization Type tricare/tricare east    PT Start Time 1434    PT Stop Time 1515    PT Time Calculation (min) 41 min    Activity Tolerance Patient tolerated treatment well    Behavior During Therapy [facility] for tasks assessed/performed             Past Medical History:  Diagnosis Date   Hypertension     Past Surgical History:  Procedure Laterality Date   FOOT SURGERY     HEMORRHOID SURGERY      There were no vitals filed for this visit.   Subjective Assessment - 05/19/21 1438     Subjective Pt reports he is feeling better than last week.  He gets occasional "weird feeling" in dorsum of Rt hand (points to [facility] of 4th and 5th digits) if he is driving with hands on top of wheel for long periods of time.  He has taken some things out of his gym routine, like sit ups and row machine.  He has only done planks and side planks daily over the last week due to time constraints.    Currently in Pain? No/denies    Pain Score 0-No pain                [facility] PT Assessment - 05/19/21 0001       Assessment   Medical Diagnosis Rt Cervical radiculopathy    Referring Provider (PT) Dr Benjamin Stain    Onset Date/Surgical Date 03/05/21    Hand Dominance Right    Next MD Visit 06/04/21      AROM   Cervical - Right Side Bend 42    Cervical - Left Side Bend 35              [facility] Adult PT Treatment/Exercise - 05/19/21 0001       Neck Exercises: Stretches   Upper Trapezius Stretch 2 reps;Left;20 seconds    Levator Stretch  Left;2 reps      Shoulder Exercises: Standing   External Rotation --    Theraband Level (Shoulder External Rotation) --    External Rotation Limitations tactile cues for form.    Row Right;Left;10 reps   bow and arrow with 3 sec hold.   Theraband Level (Shoulder Row) Level 3 (Green)    Other Standing Exercises overhead press with 5# in each hand x 10 reps, slow eccentric lowering.  repeated with added bicep curls to overhead press x 10, 5#    Other Standing Exercises bent over  fly with 5# x 10 reps, tactile cues for head position and form, 2 sets.      Shoulder Exercises: ROM/Strengthening   UBE (Upper Arm Bike) L3: 1.5 min forward, 1.5 min backward.      Shoulder Exercises: Stretch   Other Shoulder Stretches 3 position doorway stretch 2 x 20 sec    Other Shoulder Stretches overhead lateral flexion stretch x 10 sec x 3 reps each side.  bilat bicep stretch x 15 sec x 2  Manual Therapy   Soft tissue mobilization IASTM to Rt cervical erectors, levator, upper trap, and rhomboid to decrease fascial restrictions and improve ROM.              PT Long Term Goals - 05/13/21 1640       PT LONG TERM GOAL #1   Title Improve sitting posture and alignment with patient to demonstrate more upright spinal alignment with sitting    Baseline requires freq cues to bring head to neutral from laterally flexed to Rt.    Time 4    Period Weeks    Status Revised    Target Date 06/12/21      PT LONG TERM GOAL #2   Title Patient to report resolution of Rt UE tingling and numbness with functional activities and sleeping    Baseline reduced radicular symptoms, but still present.    Time 4    Period Weeks    Status Revised    Target Date 06/12/21      PT LONG TERM GOAL #3   Title Increase AROM cervical spine to WFL's throughout and pain free    Baseline limited Lt lateral flexion, but painfree.    Time 4    Period Weeks    Status Revised    Target Date 06/12/21      PT LONG TERM GOAL #4    Title Independent in HEP    Baseline pt verbalized inconsistance with HEP; continues to require cues for form of exercises.    Time 4    Period Weeks    Status Revised    Target Date 06/12/21      PT LONG TERM GOAL #5   Title Improve functional limitation score to 71    Baseline 55 at intake, 63 on 05/05/21    Time 4    Period Weeks    Status Revised    Target Date 06/12/21                   Plan - 05/19/21 1459     Clinical Impression Statement Good improvement in neck lateral flexion compared to last week.  Pt requires visual/ tactile cues for form. Tolerated exercises without any production of symptoms in RUE. Pt demonstrating improved posture, only requiring one cue to bring head to neutral posture. Progressing towards LTGs.    Rehab Potential Good    PT Frequency 1x / week    PT Duration 4 weeks    PT Treatment/Interventions ADLs/Self Care Home Management;Aquatic Therapy;Cryotherapy;Electrical Stimulation;Iontophoresis 4mg /ml Dexamethasone;Moist Heat;Traction;Ultrasound;Therapeutic activities;Therapeutic exercise;Neuromuscular re-education;Patient/family education;Manual techniques;Dry needling;Taping    PT Next Visit Plan postural strengthening and correction, manual and modalities as needed; d/c planning as able    Consulted and Agree with Plan of Care Patient             Patient will benefit from skilled therapeutic intervention in order to improve the following deficits and impairments:  Decreased range of motion, Impaired UE functional use, Decreased activity tolerance, Pain, Impaired flexibility, Improper body mechanics, Decreased mobility, Impaired sensation, Postural dysfunction  Visit Diagnosis: Radiculopathy, cervical region  Abnormal posture  Other symptoms and signs involving the musculoskeletal system     Problem List Patient Active Problem List   Diagnosis Date Noted   Radiculitis of right cervical region 03/29/2021   Back pain 02/21/2018    ED (erectile dysfunction) 02/21/2018   Abnormal glucose 10/11/2017   Need for hepatitis C screening test 10/10/2016   Dyslipidemia 05/13/2015   Leukopenia  05/13/2015   Family history of premature CAD 10/02/2012   Benign essential hypertension 06/06/2011   Colon polyps 06/01/2011    Mayer Camel, PTA 05/19/21 3:23 PM  [facility] [place] [address] [place], [place], [address] Phone: [phone]   Fax:  [phone]  Name: Mathew Carter MRN: [id number] Date of Birth: 1961-04-03

## 2021-05-25 ENCOUNTER — Encounter: Payer: Self-pay | Admitting: Rehabilitative and Restorative Service Providers"

## 2021-05-25 ENCOUNTER — Ambulatory Visit (INDEPENDENT_AMBULATORY_CARE_PROVIDER_SITE_OTHER): Admitting: Rehabilitative and Restorative Service Providers"

## 2021-05-25 ENCOUNTER — Other Ambulatory Visit: Payer: Self-pay

## 2021-05-25 DIAGNOSIS — M5412 Radiculopathy, cervical region: Secondary | ICD-10-CM | POA: Diagnosis not present

## 2021-05-25 DIAGNOSIS — R293 Abnormal posture: Secondary | ICD-10-CM | POA: Diagnosis not present

## 2021-05-25 NOTE — Therapy (Signed)
[facility] [address] [place], [place], [address] Phone: [phone]   Fax:  [phone]  Physical Therapy Treatment  Patient Details  Name: Mathew Carter MRN: [id number] Date of Birth: 06/17/1961 Referring Provider (PT): Dr Benjamin Stain   Encounter Date: 05/25/2021   PT End of Session - 05/25/21 1024     Visit Number 12    Number of Visits 14    Date for PT Re-Evaluation 06/12/21    Authorization Type tricare/[employer]    PT Start Time 1022    PT Stop Time 1100    PT Time Calculation (min) 38 min    Activity Tolerance Patient tolerated treatment well    Behavior During Therapy [facility] for tasks assessed/performed             Past Medical History:  Diagnosis Date   Hypertension     Past Surgical History:  Procedure Laterality Date   FOOT SURGERY     HEMORRHOID SURGERY      There were no vitals filed for this visit.   Subjective Assessment - 05/25/21 1023     Subjective There is "nothing major" with hand or arm and neck today.  He continues to rate "stiffness" as a 3/10.    Patient Stated Goals get the arm feeling right again    Currently in Pain? Yes    Pain Score 3     Pain Location Neck    Pain Orientation Right    Pain Descriptors / Indicators Tightness    Pain Type Chronic pain    Pain Onset More than a month ago    Pain Frequency Intermittent    Aggravating Factors  unsure                [facility] PT Assessment - 05/25/21 1026       Assessment   Medical Diagnosis Rt Cervical radiculopathy    Referring Provider (PT) Dr Benjamin Stain    Onset Date/Surgical Date 03/05/21    Hand Dominance Right                           [facility] Adult PT Treatment/Exercise - 05/25/21 1045       Exercises   Exercises Neck;Other Exercises;Shoulder      Neck Exercises: Standing   Other Standing Exercises curls x 15 reps x 8 lbs (bicep curbs), standing neck stabilization while  performing shoulder flexion with one UE and maintaining contralateral UE in elbow flexion with weight x 12 reps    Other Standing Exercises overhead lifting x 16 lbs (8 each UE) x 12 reps      Neck Exercises: Supine   Cervical Isometrics Flexion    Cervical Isometrics Limitations lifting head out of PTs hands and holding isometrically x 3 reps      Neck Exercises: Stretches   Upper Trapezius Stretch 2 reps;20 seconds;Left;Right      Shoulder Exercises: Seated   Row Strengthening;Both;10 reps    Row Limitations seated on floor with rows using cybex machine (3 plates)      Shoulder Exercises: Prone   Other Prone Exercises plank position performing rows (worked on hips stable and only lifting from scapula)-- did with weight and without weight      Shoulder Exercises: Standing   External Rotation Strengthening;Right;10 reps    Theraband Level (Shoulder External Rotation) Level 4 (Blue)    External Rotation Limitations arm abducted to 90 and then ER; needed  cues for correct form and patient used shoulder shrug, therefore switched to rows    Extension Strengthening;Right;Left;12 reps    Theraband Level (Shoulder Extension) Level 4 (Blue)    Extension Limitations with cues to avoid shoulder shrug    Row Right;Left;10 reps    Theraband Level (Shoulder Row) Level 4 (Blue)      Manual Therapy   Manual Therapy Soft tissue mobilization;Manual Traction    Manual therapy comments supine    Soft tissue mobilization STM bilateral scalenes and upper trap    Other Manual Therapy passive stretching into SB and flexion                         PT Long Term Goals - 05/13/21 1640       PT LONG TERM GOAL #1   Title Improve sitting posture and alignment with patient to demonstrate more upright spinal alignment with sitting    Baseline requires freq cues to bring head to neutral from laterally flexed to Rt.    Time 4    Period Weeks    Status Revised    Target Date 06/12/21      PT  LONG TERM GOAL #2   Title Patient to report resolution of Rt UE tingling and numbness with functional activities and sleeping    Baseline reduced radicular symptoms, but still present.    Time 4    Period Weeks    Status Revised    Target Date 06/12/21      PT LONG TERM GOAL #3   Title Increase AROM cervical spine to WFL's throughout and pain free    Baseline limited Lt lateral flexion, but painfree.    Time 4    Period Weeks    Status Revised    Target Date 06/12/21      PT LONG TERM GOAL #4   Title Independent in HEP    Baseline pt verbalized inconsistance with HEP; continues to require cues for form of exercises.    Time 4    Period Weeks    Status Revised    Target Date 06/12/21      PT LONG TERM GOAL #5   Title Improve functional limitation score to 71    Baseline 55 at intake, 63 on 05/05/21    Time 4    Period Weeks    Status Revised    Target Date 06/12/21                   Plan - 05/25/21 1332     Clinical Impression Statement The patient is continuing to improve with good ROM and return to lifting at the gym (modified to be a lower weight).  PT continuing to provide some cues on exercise technique to avoid shoulder shrug.  Plan to continue working to LTGs, anticipating potential d/c next visit.    PT Treatment/Interventions ADLs/Self Care Home Management;Aquatic Therapy;Cryotherapy;Electrical Stimulation;Iontophoresis 4mg /ml Dexamethasone;Moist Heat;Traction;Ultrasound;Therapeutic activities;Therapeutic exercise;Neuromuscular re-education;Patient/family education;Manual techniques;Dry needling;Taping    PT Next Visit Plan last visit before MD appt; plan to progress HEP, return to strength training, discuss mechanics with lifting, and d/c planning.    PT Home Exercise Plan [id number]    Consulted and Agree with Plan of Care Patient             Patient will benefit from skilled therapeutic intervention in order to improve the following deficits and  impairments:     Visit Diagnosis: Radiculopathy, cervical region  Abnormal posture     Problem List Patient Active Problem List   Diagnosis Date Noted   Radiculitis of right cervical region 03/29/2021   Back pain 02/21/2018   ED (erectile dysfunction) 02/21/2018   Abnormal glucose 10/11/2017   Need for hepatitis C screening test 10/10/2016   Dyslipidemia 05/13/2015   Leukopenia 05/13/2015   Family history of premature CAD 10/02/2012   Benign essential hypertension 06/06/2011   Colon polyps 06/01/2011    Calliope Delangel, PT 05/25/2021, 1:37 PM  [facility] [address] [place], [place], [address] Phone: [phone]   Fax:  [phone]  Name: Mathew Carter MRN: [id number] Date of Birth: 10-Feb-1961

## 2021-05-28 ENCOUNTER — Ambulatory Visit: Admitting: Sports Medicine

## 2021-06-03 ENCOUNTER — Other Ambulatory Visit: Payer: Self-pay

## 2021-06-03 ENCOUNTER — Encounter: Payer: Self-pay | Admitting: Physical Therapy

## 2021-06-03 ENCOUNTER — Ambulatory Visit (INDEPENDENT_AMBULATORY_CARE_PROVIDER_SITE_OTHER): Admitting: Physical Therapy

## 2021-06-03 DIAGNOSIS — M5412 Radiculopathy, cervical region: Secondary | ICD-10-CM

## 2021-06-03 DIAGNOSIS — R293 Abnormal posture: Secondary | ICD-10-CM

## 2021-06-03 DIAGNOSIS — R29898 Other symptoms and signs involving the musculoskeletal system: Secondary | ICD-10-CM

## 2021-06-03 NOTE — Therapy (Addendum)
[facility] [address] [place], [place], [address] Phone: [phone]   Fax:  [phone]  Physical Therapy Treatment and Discharge Summary  PHYSICAL THERAPY DISCHARGE SUMMARY  Visits from Start of Care: 13  Current functional level related to goals / functional outcomes: See progress note for discharge status    Remaining deficits: Should continue with HEP   Education / Equipment: HEP    Patient agrees to discharge. Patient goals were met. Patient is being discharged due to meeting the stated rehab goals.  Mathew Carter PT, MPH 06/16/21 7:04 AM   Patient Details  Name: Mathew Carter MRN: [id number] Date of Birth: [birth year] Referring Provider (PT): Dr Dianah Field   Encounter Date: 06/03/2021   PT End of Session - 06/03/21 1548     Visit Number 13    Number of Visits 14    Date for PT Re-Evaluation 06/12/21    Authorization Type tricare/[employer]are [employer]    PT Start Time 1544   pt arrived late   PT Stop Time 1611    PT Time Calculation (min) 27 min    Activity Tolerance Patient tolerated treatment well    Behavior During Therapy WFL for tasks assessed/performed             Past Medical History:  Diagnosis Date   Hypertension     Past Surgical History:  Procedure Laterality Date   FOOT SURGERY     HEMORRHOID SURGERY      There were no vitals filed for this visit.   Subjective Assessment - 06/03/21 1545     Subjective Pt reports he no longer has numbness / tingling in RUE - states it has been absent for last 2-3 wks.  He reports that he has been more mindful of how he works out at gym and his posture (head position).    Patient Stated Goals get the arm feeling right again    Currently in Pain? No/denies    Pain Score 0-No pain                [facility] PT Assessment - 06/03/21 0001       Assessment   Medical Diagnosis Rt Cervical radiculopathy    Referring Provider (PT) Dr  Dianah Field    Onset Date/Surgical Date 03/05/21    Hand Dominance Right    Next MD Visit 06/04/21      Observation/Other Assessments   Focus on Therapeutic Outcomes (FOTO)  71 functional score      AROM   Cervical Flexion 60    Cervical Extension 50    Cervical - Right Side Bend 46    Cervical - Left Side Bend 45    Cervical - Right Rotation 70    Cervical - Left Rotation 71               [facility] Adult PT Treatment/Exercise - 06/03/21 0001       Neck Exercises: Machines for Strengthening   UBE (Upper Arm Bike) L5 x 3 min alt fwd/bkwd    Cybex Row 3 plates x 10, seated.    Lat Pull 3 plates x 10 reps, cues for form.      Neck Exercises: Stretches   Upper Trapezius Stretch 20 seconds;Left;Right;3 reps      Shoulder Exercises: Sidelying   Other Sidelying Exercises open book cervical/thoracic rotation with green band x 5 each side.      Shoulder Exercises: Standing   External Rotation Strengthening;Right;10 reps  Theraband Level (Shoulder External Rotation) Level 4 (Blue)    Other Standing Exercises bicep curl to overhead press with 8# in each hand x 10, mirror for visual cues of posture / form .      Shoulder Exercises: Stretch   Other Shoulder Stretches 3 position doorway stretch 2 x 20 sec                PT Long Term Goals - 06/03/21 1549       PT LONG TERM GOAL #1   Title Improve sitting posture and alignment with patient to demonstrate more upright spinal alignment with sitting    Time 4    Period Weeks    Status Achieved      PT LONG TERM GOAL #2   Title Patient to report resolution of Rt UE tingling and numbness with functional activities and sleeping    Time 4    Period Weeks    Status Achieved      PT LONG TERM GOAL #3   Title Increase AROM cervical spine to WFL's throughout and pain free    Baseline limited Lt lateral flexion, but painfree.    Time 4    Period Weeks    Status Achieved      PT LONG TERM GOAL #4   Title Independent in  HEP    Time 4    Period Weeks    Status Achieved      PT LONG TERM GOAL #5   Title Improve functional limitation score to 71    Time 4    Period Weeks    Status Achieved                   Plan - 06/03/21 1612     Clinical Impression Statement Pt's posture is much improved, with head in more neutral position with no need for cues to correct from Rt lateral flexion position. Positive improvement in cervical ROM. FOTO has improved to 71 functional status.  Pt has met all goals and verbalized readiness to d/c to HEP.    PT Frequency 1x / week    PT Duration 4 weeks    PT Treatment/Interventions ADLs/Self Care Home Management;Aquatic Therapy;Cryotherapy;Electrical Stimulation;Iontophoresis 42m/ml Dexamethasone;Moist Heat;Traction;Ultrasound;Therapeutic activities;Therapeutic exercise;Neuromuscular re-education;Patient/family education;Manual techniques;Dry needling;Taping    PT Next Visit Plan d/c to HEP.    PT Home Exercise Plan [id number]    Consulted and Agree with Plan of Care Patient             Patient will benefit from skilled therapeutic intervention in order to improve the following deficits and impairments:  Decreased range of motion, Impaired UE functional use, Decreased activity tolerance, Pain, Impaired flexibility, Improper body mechanics, Decreased mobility, Impaired sensation, Postural dysfunction  Visit Diagnosis: Radiculopathy, cervical region  Abnormal posture  Other symptoms and signs involving the musculoskeletal system     Problem List Patient Active Problem List   Diagnosis Date Noted   Radiculitis of right cervical region 03/29/2021   Back pain 02/21/2018   ED (erectile dysfunction) 02/21/2018   Abnormal glucose 10/11/2017   Need for hepatitis C screening test 10/10/2016   Dyslipidemia 05/13/2015   Leukopenia 05/13/2015   Family history of premature CAD 10/02/2012   Benign essential hypertension 06/06/2011   Colon polyps 06/01/2011    JKerin Perna PTA 06/03/21 5:58 PM  Mathew P. HHelene KelpPT, MPH 06/16/21 7:04 AM  [address] [address]Phone: [phone]  Fax:  [phone]  Name: Mathew Carter MRN: [id number] Date of Birth: 1961/06/30

## 2021-06-04 ENCOUNTER — Ambulatory Visit (INDEPENDENT_AMBULATORY_CARE_PROVIDER_SITE_OTHER): Admitting: Sports Medicine

## 2021-06-04 DIAGNOSIS — M5412 Radiculopathy, cervical region: Secondary | ICD-10-CM
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
## 2024-05-21 ENCOUNTER — Encounter: Payer: Self-pay | Admitting: Sports Medicine
